# Patient Record
Sex: Male | Born: 1955 | Race: White | Hispanic: No | Marital: Married | Smoking: Former smoker
Health system: Southern US, Community
[De-identification: ages and names within clinical notes are randomized; demographics above are authoritative.]

## PROBLEM LIST (undated history)

## (undated) DIAGNOSIS — I219 Acute myocardial infarction, unspecified: Secondary | ICD-10-CM

## (undated) DIAGNOSIS — E785 Hyperlipidemia, unspecified: Secondary | ICD-10-CM

## (undated) DIAGNOSIS — I10 Essential (primary) hypertension: Secondary | ICD-10-CM

## (undated) DIAGNOSIS — I2510 Atherosclerotic heart disease of native coronary artery without angina pectoris: Principal | ICD-10-CM

## (undated) DIAGNOSIS — R0683 Snoring: Secondary | ICD-10-CM

## (undated) DIAGNOSIS — I209 Angina pectoris, unspecified: Secondary | ICD-10-CM

## (undated) DIAGNOSIS — F17200 Nicotine dependence, unspecified, uncomplicated: Secondary | ICD-10-CM

## (undated) DIAGNOSIS — I2119 ST elevation (STEMI) myocardial infarction involving other coronary artery of inferior wall: Secondary | ICD-10-CM

## (undated) DIAGNOSIS — R079 Chest pain, unspecified: Secondary | ICD-10-CM

## (undated) HISTORY — DX: Atherosclerotic heart disease of native coronary artery without angina pectoris: I25.10

## (undated) HISTORY — DX: Hyperlipidemia, unspecified: E78.5

## (undated) HISTORY — DX: Snoring: R06.83

## (undated) HISTORY — PX: BASAL CELL CARCINOMA EXCISION: SHX1214

## (undated) HISTORY — DX: Essential (primary) hypertension: I10

## (undated) HISTORY — DX: Nicotine dependence, unspecified, uncomplicated: F17.200

## (undated) HISTORY — PX: NECK SURGERY: SHX720

## (undated) HISTORY — PX: HAND SURGERY: SHX662

## (undated) HISTORY — PX: ANKLE FRACTURE SURGERY: SHX122

## (undated) HISTORY — DX: ST elevation (STEMI) myocardial infarction involving other coronary artery of inferior wall: I21.19

## (undated) HISTORY — DX: Chest pain, unspecified: R07.9

## (undated) HISTORY — PX: CORONARY STENT PLACEMENT: SHX6853

---

## 1998-05-07 ENCOUNTER — Ambulatory Visit (HOSPITAL_COMMUNITY): Admission: RE | Admit: 1998-05-07 | Discharge: 1998-05-07 | Payer: Self-pay | Admitting: Neurosurgery

## 1998-05-14 ENCOUNTER — Ambulatory Visit (HOSPITAL_COMMUNITY): Admission: RE | Admit: 1998-05-14 | Discharge: 1998-05-14 | Payer: Self-pay | Admitting: Neurosurgery

## 1998-11-21 ENCOUNTER — Encounter: Payer: Self-pay | Admitting: Family Medicine

## 1998-11-21 ENCOUNTER — Ambulatory Visit (HOSPITAL_COMMUNITY): Admission: RE | Admit: 1998-11-21 | Discharge: 1998-11-21 | Payer: Self-pay | Admitting: Family Medicine

## 2001-11-01 ENCOUNTER — Encounter: Payer: Self-pay | Admitting: Emergency Medicine

## 2001-11-01 ENCOUNTER — Observation Stay (HOSPITAL_COMMUNITY): Admission: AC | Admit: 2001-11-01 | Discharge: 2001-11-02 | Payer: Self-pay

## 2007-03-19 ENCOUNTER — Ambulatory Visit (HOSPITAL_COMMUNITY): Admission: RE | Admit: 2007-03-19 | Discharge: 2007-03-19 | Payer: Self-pay | Admitting: Family Medicine

## 2007-10-08 ENCOUNTER — Ambulatory Visit (HOSPITAL_BASED_OUTPATIENT_CLINIC_OR_DEPARTMENT_OTHER): Admission: RE | Admit: 2007-10-08 | Discharge: 2007-10-08 | Payer: Self-pay | Admitting: Orthopedic Surgery

## 2010-12-06 DIAGNOSIS — I2510 Atherosclerotic heart disease of native coronary artery without angina pectoris: Secondary | ICD-10-CM

## 2010-12-06 HISTORY — DX: Atherosclerotic heart disease of native coronary artery without angina pectoris: I25.10

## 2010-12-12 ENCOUNTER — Inpatient Hospital Stay (HOSPITAL_COMMUNITY)
Admission: EM | Admit: 2010-12-12 | Discharge: 2010-12-15 | DRG: 808 | Disposition: A | Discharge status: Home / Self Care | Payer: BC Managed Care – PPO | Source: Ambulatory Visit | Attending: Cardiology | Admitting: Cardiology

## 2010-12-12 ENCOUNTER — Inpatient Hospital Stay (HOSPITAL_COMMUNITY): Admit: 2010-12-12 | Payer: Self-pay

## 2010-12-12 ENCOUNTER — Inpatient Hospital Stay (HOSPITAL_COMMUNITY): Payer: BC Managed Care – PPO

## 2010-12-12 DIAGNOSIS — Z7982 Long term (current) use of aspirin: Secondary | ICD-10-CM

## 2010-12-12 DIAGNOSIS — R55 Syncope and collapse: Secondary | ICD-10-CM | POA: Diagnosis present

## 2010-12-12 DIAGNOSIS — Z7902 Long term (current) use of antithrombotics/antiplatelets: Secondary | ICD-10-CM

## 2010-12-12 DIAGNOSIS — Y92009 Unspecified place in unspecified non-institutional (private) residence as the place of occurrence of the external cause: Secondary | ICD-10-CM

## 2010-12-12 DIAGNOSIS — Z006 Encounter for examination for normal comparison and control in clinical research program: Secondary | ICD-10-CM

## 2010-12-12 DIAGNOSIS — E785 Hyperlipidemia, unspecified: Secondary | ICD-10-CM | POA: Diagnosis present

## 2010-12-12 DIAGNOSIS — I2119 ST elevation (STEMI) myocardial infarction involving other coronary artery of inferior wall: Secondary | ICD-10-CM

## 2010-12-12 DIAGNOSIS — F17200 Nicotine dependence, unspecified, uncomplicated: Secondary | ICD-10-CM | POA: Diagnosis present

## 2010-12-12 DIAGNOSIS — Z8249 Family history of ischemic heart disease and other diseases of the circulatory system: Secondary | ICD-10-CM

## 2010-12-12 DIAGNOSIS — I2510 Atherosclerotic heart disease of native coronary artery without angina pectoris: Secondary | ICD-10-CM | POA: Diagnosis present

## 2010-12-12 DIAGNOSIS — I4891 Unspecified atrial fibrillation: Secondary | ICD-10-CM | POA: Diagnosis present

## 2010-12-12 DIAGNOSIS — S0180XA Unspecified open wound of other part of head, initial encounter: Secondary | ICD-10-CM | POA: Diagnosis present

## 2010-12-12 DIAGNOSIS — I219 Acute myocardial infarction, unspecified: Secondary | ICD-10-CM

## 2010-12-12 DIAGNOSIS — W1809XA Striking against other object with subsequent fall, initial encounter: Secondary | ICD-10-CM | POA: Diagnosis present

## 2010-12-12 LAB — CBC
HCT: 43 % (ref 39.0–52.0)
Hemoglobin: 14.6 g/dL (ref 13.0–17.0)
MCH: 30.1 pg (ref 26.0–34.0)
MCHC: 35.1 g/dL (ref 30.0–36.0)
Platelets: 260 10*3/uL (ref 150–400)
RBC: 4.88 MIL/uL (ref 4.22–5.81)
RDW: 13.2 % (ref 11.5–15.5)

## 2010-12-12 LAB — CARDIAC PANEL(CRET KIN+CKTOT+MB+TROPI)
CK, MB: 322.1 ng/mL (ref 0.3–4.0)
Relative Index: 12.4 — ABNORMAL HIGH (ref 0.0–2.5)
Relative Index: 13 — ABNORMAL HIGH (ref 0.0–2.5)
Relative Index: 14.5 — ABNORMAL HIGH (ref 0.0–2.5)
Total CK: 156 U/L (ref 7–232)
Total CK: 2227 U/L — ABNORMAL HIGH (ref 7–232)
Troponin I: 0.45 ng/mL — ABNORMAL HIGH (ref 0.00–0.06)
Troponin I: 0.46 ng/mL — ABNORMAL HIGH (ref 0.00–0.06)
Troponin I: 36.53 ng/mL (ref 0.00–0.06)

## 2010-12-12 LAB — PROTIME-INR
INR: 6.23 (ref 0.00–1.49)
Prothrombin Time: 54.7 seconds — ABNORMAL HIGH (ref 11.6–15.2)

## 2010-12-12 LAB — DIFFERENTIAL
Basophils Absolute: 0 10*3/uL (ref 0.0–0.1)
Basophils Relative: 0 % (ref 0–1)
Lymphocytes Relative: 9 % — ABNORMAL LOW (ref 12–46)
Monocytes Absolute: 1.3 10*3/uL — ABNORMAL HIGH (ref 0.1–1.0)
Monocytes Relative: 8 % (ref 3–12)
Neutro Abs: 12.9 10*3/uL — ABNORMAL HIGH (ref 1.7–7.7)
Neutrophils Relative %: 83 % — ABNORMAL HIGH (ref 43–77)

## 2010-12-12 LAB — POCT I-STAT, CHEM 8
Calcium, Ion: 1.11 mmol/L — ABNORMAL LOW (ref 1.12–1.32)
Creatinine, Ser: 1.1 mg/dL (ref 0.4–1.5)
Glucose, Bld: 146 mg/dL — ABNORMAL HIGH (ref 70–99)
HCT: 44 % (ref 39.0–52.0)
Hemoglobin: 15 g/dL (ref 13.0–17.0)
Potassium: 4 mEq/L (ref 3.5–5.1)
TCO2: 17 mmol/L (ref 0–100)

## 2010-12-12 LAB — COMPREHENSIVE METABOLIC PANEL
ALT: 25 U/L (ref 0–53)
BUN: 7 mg/dL (ref 6–23)
CO2: 16 mEq/L — ABNORMAL LOW (ref 19–32)
Calcium: 8.4 mg/dL (ref 8.4–10.5)
GFR calc non Af Amer: >60 mL/min (ref >60)
Glucose, Bld: 145 mg/dL — ABNORMAL HIGH (ref 70–99)
Total Protein: 6.1 g/dL (ref 6.0–8.3)

## 2010-12-12 LAB — MRSA PCR SCREENING: MRSA by PCR: NEGATIVE

## 2010-12-12 LAB — TSH: TSH: 0.43 u[IU]/mL (ref 0.350–4.500)

## 2010-12-13 LAB — BASIC METABOLIC PANEL
CO2: 21 mEq/L (ref 19–32)
Calcium: 8.4 mg/dL (ref 8.4–10.5)
Chloride: 110 mEq/L (ref 96–112)
GFR calc Af Amer: >60 mL/min (ref >60)
Potassium: 3.5 mEq/L (ref 3.5–5.1)
Sodium: 140 mEq/L (ref 135–145)

## 2010-12-13 LAB — CBC
HCT: 36.8 % — ABNORMAL LOW (ref 39.0–52.0)
Hemoglobin: 12.5 g/dL — ABNORMAL LOW (ref 13.0–17.0)
MCHC: 34 g/dL (ref 30.0–36.0)
RBC: 4.14 MIL/uL — ABNORMAL LOW (ref 4.22–5.81)
WBC: 13.6 10*3/uL — ABNORMAL HIGH (ref 4.0–10.5)

## 2010-12-13 LAB — LIPID PANEL
HDL: 28 mg/dL — ABNORMAL LOW (ref >39)
LDL Cholesterol: 67 mg/dL (ref 0–99)
Triglycerides: 115 mg/dL (ref <150)
VLDL: 23 mg/dL (ref 0–40)

## 2010-12-13 LAB — CARDIAC PANEL(CRET KIN+CKTOT+MB+TROPI): Relative Index: 11.6 — ABNORMAL HIGH (ref 0.0–2.5)

## 2010-12-13 LAB — HEMOGLOBIN A1C: Hgb A1c MFr Bld: 5.5 % (ref <5.7)

## 2010-12-14 DIAGNOSIS — I214 Non-ST elevation (NSTEMI) myocardial infarction: Secondary | ICD-10-CM

## 2010-12-14 LAB — CBC
HCT: 38.8 % — ABNORMAL LOW (ref 39.0–52.0)
Hemoglobin: 13.2 g/dL (ref 13.0–17.0)
MCHC: 34 g/dL (ref 30.0–36.0)
MCV: 88.8 fL (ref 78.0–100.0)

## 2010-12-14 LAB — BASIC METABOLIC PANEL
CO2: 21 mEq/L (ref 19–32)
Calcium: 8.3 mg/dL — ABNORMAL LOW (ref 8.4–10.5)
Glucose, Bld: 88 mg/dL (ref 70–99)
Potassium: 3.8 mEq/L (ref 3.5–5.1)
Sodium: 138 mEq/L (ref 135–145)

## 2010-12-18 NOTE — Op Note (Signed)
  NAME:  [name], [name]                ACCOUNT NO.:  [id number]  MEDICAL RECORD NO.:  [id number]           PATIENT TYPE:  I  LOCATION:  2903                         FACILITY:  [facility]  PHYSICIAN:  Gabrielle Dare. Janee Morn, M.D.DATE OF BIRTH:  [birth year]  DATE OF PROCEDURE:  12/12/2010 DATE OF DISCHARGE:                              OPERATIVE REPORT   CHIEF COMPLAINT:  Chin laceration.  HISTORY OF PRESENT ILLNESS:  Mr. Jungwirth is a 55 year old gentleman who fell after suffering myocardial infarction.  He was admitted to the cardiology service and has been taken to the cath lab.  He suffered a 2- cm chin laceration.  We were asked to see him for closure.  PROCEDURE IN DETAIL:  The patient was identified in the cardiac intensive care unit.  He was agreeable to the procedure which we discussed in some detail.  He gave his consent and we proceeded.  PROCEDURE IN DETAIL:  The patient's chin was prepped in sterile fashion and then draped.  The wound was thoroughly cleaned with Betadine.  Next, 2% lidocaine with epinephrine was injected for local anesthetic.  The wound was then closed in a simple fashion with interrupted 4-0 Prolene sutures.  It came together without tension and good approximation.  Some bacitracin to a sterile gauze were applied.  The patient tolerated the procedure well.  Please note, we did not do a time-out procedure at the beginning.  There were no complications.     Gabrielle Dare Janee Morn, M.D.     BET/MEDQ  D:  12/12/2010  T:  12/13/2010  Job:  [id number]  cc:   Peter M. [place], M.D.  Electronically Signed by Violeta Gelinas M.D. on 12/18/2010 10:14:42 AM

## 2010-12-19 LAB — CARDIAC PANEL(CRET KIN+CKTOT+MB+TROPI)
CK, MB: 315.7 ng/mL (ref 0.3–4.0)
Relative Index: 11.2 — ABNORMAL HIGH (ref 0.0–2.5)

## 2010-12-20 NOTE — Procedures (Signed)
NAME:  Daniel Bonilla, Daniel Bonilla NO.:  [id number]  MEDICAL RECORD NO.:  [id number]           PATIENT TYPE:  I  LOCATION:  2903                         FACILITY:  [facility]  PHYSICIAN:  Peter M. Sw[place]iland, M.D.  DATE OF BIRTH:  [birth year]  DATE OF PROCEDURE:  12/12/2010 DATE OF DISCHARGE:                           CARDIAC CATHETERIZATION   SURGEON:  Peter M. [place], MD  INDICATIONS FOR PROCEDURE:  This is a 55 year old white male with a history of tobacco use who presents with an acute ST elevation myocardial infarction.  He has 3-4 mm of ST elevation in leads 2, 3, and aVF with reciprocal ST depression in the anterior leads.  The patient was also in atrial fibrillation on presentation to the lab.  He was hypotensive.  He was having ongoing chest pain.  PROCEDURES:  Left heart catheterization, coronary and left ventricular angiography, and intracoronary stenting of the distal left circumflex coronary artery.  ACCESS:  Via the right femoral artery using standard Seldinger technique.  EQUIPMENT:  6-French 4-cm right and left Judkins catheter, 6-French pigtail catheter, 6-French arterial sheath, 6-French FL-4 guide, a Prowater wire, a 2.5 x 15-mm apex balloon, a 2.5 x 18 mm Multi-Link Vision stent, a 2.5 x 15 mm [place] Trek balloon, and Export extraction catheter.  MEDICATIONS:  The patient was enrolled in our [place] study. Local anesthesia 1% Xylocaine, Versed 2 mg IV, Angiomax 0.75 mg/kg bolus followed by continuous infusion at 1.75 mg/kg per hour.  Subsequent ACT was 340 seconds.  CONTRAST:  200 mL of Omnipaque.  HEMODYNAMIC DATA:  Aortic pressure was 83/59 with a mean of 72 mmHg. Left ventricular pressure was 83 with EDP of 18 mmHg.  ANGIOGRAPHIC DATA:  The left coronary artery arises normally and is a large dominant vessel.  The left main coronary artery is normal.  The left anterior descending artery is a large vessel.  At the apex, there is a 99% stenosis  at the distal fork.  This has TIMI grade 1 flow distally.  This is a very small vessel at this location.  There is a moderate-sized diagonal vessel which appears normal.  The left circumflex coronary artery is a large dominant vessel.  There is a moderate-sized intermediate vessel which is normal.  The circumflex then gives off 4 marginal branches which appear normal.  The circumflex is occluded distally prior to the takeoff of the PDA.  The right coronary artery arises normally.  It is a small, nondominant vessel and is normal.  We proceeded with acute intervention of the distal circumflex lesion. This was crossed with the wire without much difficulty.  We initially performed balloon inflation with 2.5 mm apex balloon dilating up to 4 atmospheres and then 6 atmospheres.  Despite good balloon expansion, there was no reperfusion at this point.  We then used an Export catheter for extraction.  This yielded a moderate amount of white thrombus from the lesion and did restore flow to the PDA.  The PDA was actually a fairly small vessel.  We then stented the distal circumflex using a 2.5 x 180 mm Vision stent.  This  was deployed at 9 atmospheres.  We then postdilated using the 2.5 x 15 mm [place] Trek up to 12 atmospheres in the distal stent and 14 atmospheres in the proximal stent.  This resulted in 0% residual stenosis and TIMI grade 3 flow.  At this point, the patient's pain had decreased to 1/10.  He still had some ST elevation, but it was improved.  He remained in atrial fibrillation with controlled ventricular response.  Left ventricular angiography at this point demonstrated inferoapical severe hypokinesia with overall well-preserved LV systolic function and ejection fraction of 50%.  There was no significant mitral insufficiency.  FINAL INTERPRETATION: 1. Two-vessel obstructive coronary artery disease.  Culprit lesion is     occlusion of the distal circumflex involving the PDA.  He also  has     a high-grade stenosis at the terminal LAD at the distal fork. 2. Good left ventricular systolic function. 3. Successful stenting of the distal left circumflex coronary artery     with a bare-metal stent.          ______________________________ Peter M. [place], M.D.     PMJ/MEDQ  D:  12/12/2010  T:  12/13/2010  Job:  [id number]  cc:   [facility]  Electronically Signed by PETER Swaz[place]and M.D. on 12/20/2010 11:18:01 AM

## 2010-12-20 NOTE — H&P (Signed)
NAME:  Daniel Bonilla, Daniel Bonilla NO.:  [id number]  MEDICAL RECORD NO.:  [id number]           PATIENT TYPE:  I  LOCATION:  2903                         FACILITY:  [facility]  PHYSICIAN:  Ahtziry Saathoff M. [place], M.D.  DATE OF BIRTH:  1956/01/14  DATE OF ADMISSION:  12/12/2010 DATE OF DISCHARGE:                             HISTORY & PHYSICAL   PRIMARY CARDIOLOGIST:  New to [facility], being seen by Dr. Swaziland.  PRIMARY CARE PROVIDER:  [facility]  PROFILE:  A 55 year old Caucasian male without prior cardiac history, presents with acute inferolateral ST elevation MI.  PROBLEMS: 1. Acute inferolateral ST elevation myocardial infarction. 2. Untreated hyperlipidemia. 3. Ongoing tobacco abuse. 4. History of cervical neck surgery. 5. History of knee surgery. 6. Recent history of right shoulder pain. 7. History of right hand surgery, December 2008. 8. Status post open reduction and internal fixation of the distal     tibia-fibula fracture, December 2002.  ALLERGIES:  No known drug allergies.  HISTORY OF PRESENT ILLNESS:  A 55 year old male without prior cardiac history.  He was in his usual state of health until this morning at approximately 8:30 a.m. when he developed mild chest tightness with diaphoresis and then of his jaw.  The patient was standing and became lightheaded and having syncopal episode, striking his jaw on the toilet. He is not clear as how long he was without consciousness.  When he regained consciousness, he noted that he had a laceration in his jaw with bleeding.  He called to his son and called his mother who then called EMS.  Upon their arrival, he was noted to have 2-3 mm ST segment elevation in leads II, III, aVF and V5 and V6 with ST depression in I, aVL, V1 through V4.  Code STEMI was activated and the patient was taken to the [facility].  Still complaining a 5/10 chest pain prior to his procedure.  HOME MEDICATIONS:   Hydrocodone p.r.n.  The patient took his first dose yesterday for right shoulder pain.  FAMILY HISTORY:  His mother is alive and well.  Father died at 38 with an MI.  Sister is alive and well.  SOCIAL HISTORY:  The patient lives in [place] with his wife.  He works as an Land.  He has two grown children.  He has been smoking on and off since age 59.  He drinks about 2-6 packs of beer per week.  He previously used recreational drugs, but none in many years per his report.  He is active, but does not routinely exercise.  REVIEW OF SYSTEMS:  Positive for chest pain, dyspnea, presyncope and syncope, chills and diaphoresis.  He is a full code.  Otherwise, all systems reviewed negative.  PHYSICAL EXAMINATION:  VITAL SIGNS:  Afebrile, heart rate 87, respirations 28, blood pressure 112/80, pulse ox 98% on 2 liters, weight is 170 pounds. GENERAL:  Pleasant white male in no acute distress.  Awake, alert and oriented x3. PSYCHIATRY:  He has normal affect. HEENT:  Normal. NEUROLOGIC:  Grossly intact and nonfocal. SKIN:  Warm and dry with small  laceration under his chin. NECK:  Supple without bruits or JVD. LUNGS:  Respirations are regular and unlabored.  Clear to auscultation. CARDIAC:  Regular.  S1 and S2.  No S3, S4, or murmurs. ABDOMEN:  Round, soft, nontender, and nondistended.  Bowel sounds present x4. EXTREMITIES:  Warm, dry, and pink.  No clubbing, cyanosis, or edema. Dorsalis pedis and posterior tibial pulses are 1+ and equal bilaterally.  Chest x-ray is pending.  EKG shows sinus rhythm at a rate of 69 beats per minute with 2-3 mm ST-segment elevation in leads II, III, aVF, V5 through V6 along with reciprocal changes in I, aVL, V1 through V4. Sodium 131, potassium 4.0, BUN 8, creatinine 1.1, glucose 146.  ASSESSMENT AND PLAN: 1. Acute inferolateral ST elevation myocardial infarction.  Emergent     catheterization has shown occluded left circumflex and  percutaneous     coronary intervention is ongoing.  The patient has been enrolled in     a [facility] trial.  Plan Plavix tomorrow for     research protocol.  Add aspirin, statin, and beta-blocker (as     tolerated).  Eventual cardiac rehab. 2. Lipid status currently unknown.  Check lipids and LFTs.  Add high-     dose statin. 3. Tobacco abuse.  Plan cessation counseling. 4. Syncope likely arrhythmic in the setting of ischemia.  Add beta-     blocker as tolerated.  Follow on telemetry.     Nicolasa Ducking, ANP   ______________________________ Linsay Vogt M. [place], M.D.    CB/MEDQ  D:  12/12/2010  T:  12/13/2010  Job:  [id number]  Electronically Signed by Nicolasa Ducking ANP on 12/18/2010 12:07:57 PM Electronically Signed by Darean Rote [place] M.D. on 12/20/2010 11:17:41 AM

## 2010-12-29 ENCOUNTER — Encounter (INDEPENDENT_AMBULATORY_CARE_PROVIDER_SITE_OTHER): Payer: BC Managed Care – PPO | Admitting: Cardiology

## 2010-12-29 DIAGNOSIS — I2109 ST elevation (STEMI) myocardial infarction involving other coronary artery of anterior wall: Secondary | ICD-10-CM

## 2010-12-29 DIAGNOSIS — E7800 Pure hypercholesterolemia, unspecified: Secondary | ICD-10-CM

## 2010-12-29 DIAGNOSIS — F17200 Nicotine dependence, unspecified, uncomplicated: Secondary | ICD-10-CM

## 2011-01-03 NOTE — Discharge Summary (Signed)
NAME:  Daniel Bonilla, Daniel Bonilla NO.:  [id number]  MEDICAL RECORD NO.:  [id number]           PATIENT TYPE:  I  LOCATION:  4708                         FACILITY:  [facility]  PHYSICIAN:  Peter M. [place]and, M.D.  DATE OF BIRTH:  1955-12-12  DATE OF ADMISSION:  12/12/2010 DATE OF DISCHARGE:  12/15/2010                              DISCHARGE SUMMARY   PROCEDURES: 1. Cardiac catheterization. 2. Coronary arteriogram. 3. Left ventriculogram. 4. PTCA and 2.5 x 18 mm Multilink Vision bare-metal stent to the     distal circumflex. 5. Intravascular ultrasound of the left main. 6. Portable chest x-ray. 7. A 2-D echocardiogram.  PRIMARY FINAL DISCHARGE DIAGNOSES: 1. Acute inferolateral ST elevation myocardial infarction. 2. Syncope.  SECONDARY DIAGNOSES: 1. Mild left ventricular dysfunction with an EF of 50% at cath and 45-     50% by echocardiogram this admission. 2. Family history of coronary artery disease in his father. 3. Hyperlipidemia. 4. Tobacco use. 5. Status post neck surgery, knee surgery, and right hand surgery as     well as distal tib-fib repair. 6. History of right shoulder pain.  TIME OF DISCHARGE:  Forty-two minutes.  HOSPITAL COURSE:  Daniel Bonilla is a 55 year old male with no previous history of coronary artery disease.  He developed chest tightness and then had a syncopal episode.  He came to the hospital where he was having ongoing pain with ST elevation in leads II, III, aVF, as well as V5 and V6.  He was taken directly to the cath lab.  The cardiac catheterization showed a 99% stenosis at the distal portion in the LAD with TIMI-1 flow.  The circumflex was occluded prior to the takeoff of the PDA.  The circumflex was felt to be the culprit lesion and this was treated with PTCA, Export catheter for extraction and a bare-metal stent.  He had atrial fibrillation on admission and during the procedure.  He spontaneously converted to sinus  rhythm postprocedure.  He had syncope prior to admission and fell, lacerating his chin.  This was treated by the trauma team and sutured.  He was supposed to discontinue the sutures in 5 days.  Daniel Bonilla cardiac enzymes peaked at a CK-MB of 227/322 and a troponin I of 87.55.  He had dyslipidemia with an HDL of 28 and LDL of 67.  Hemoglobin A1c was 5.5, and a TSH was within normal limits.  His white count was elevated, but otherwise the CBC had no significant abnormalities.  A chest x-ray showed no acute disease and an echocardiogram showed an EF of 45-50% with some wall motion abnormalities and diastolic function was indeterminant.  Central venous pressure was felt to be normal.  Daniel Bonilla was seen by Cardiac Rehab and ambulated with them.  He will follow up with cardiac rehab as an outpatient.  By December 15, 2010, Daniel Bonilla had improved to the point that Dr. Swaziland considered him stable for discharge.  DISCHARGE INSTRUCTIONS: 1. His activity level is to be increased gradually.  He is to call our     office for problems with the cath site.  He  is not to do any     lifting for 2 weeks.  He is to do no driving four days and no     sexual activity for a week.  He may shower.  He is to apply     antibiotic ointment to his chin and keep the incision clean.  He is     to follow up with the [facility]uma [facility] on December 21, 2010, at 2 p.m.     and with Dr. [place]iland on December 29, 2010, at 4:15.  He is to follow     up with [facility] as needed or as     scheduled.  DISCHARGE MEDICATIONS: 1. Tylenol 325 mg 1-2 tabs q.4 hours p.r.n. 2. Lopressor 50 mg b.i.d. 3. Crestor 40 mg a day. 4. Sublingual nitroglycerin p.r.n. 5. Ibuprofen 200 mg 2-3 tablets q.8 hours p.r.n. as prior to     admission. 6. Hydrocodone p.r.n. as prior to admission. 7. Aspirin 81 mg daily. 8. Plavix 75 mg daily.     Theodore Demark, PA-C   ______________________________ Peter M.  [place], M.D.    RB/MEDQ  D:  12/15/2010  T:  12/16/2010  Job:  [id number]  cc:   [facility]  Electronically Signed by Theodore Demark PA-C on 12/22/2010 12:05:59 PM Electronically Signed by PETER [place] M.D. on 01/03/2011 04:49:28 PM

## 2011-01-11 NOTE — Procedures (Signed)
  NAME:  Daniel Bonilla, Daniel Bonilla                ACCOUNT NO.:  [id number]  MEDICAL RECORD NO.:  [id number]           PATIENT TYPE:  I  LOCATION:  2903                         FACILITY:  [facility]  PHYSICIAN:  Veverly Fells. Excell Seltzer, MD  DATE OF BIRTH:  [birth year]  DATE OF PROCEDURE:  12/13/2010 DATE OF DISCHARGE:                           CARDIAC CATHETERIZATION   PROCEDURE:  Intravascular ultrasound of the left main.  SURGEON:  Veverly Fells. Excell Seltzer, MD  Hilarie Fredrickson INDICATIONS:  [name] is a 55 year old gentleman who presented yesterday with an inferolateral and ST-elevation infarction. He was treated with primary PCI of the left circumflex.  He also had slow flow noted.  The patient's culprit vessel was the left circumflex which was a dominant vessel.  He also had slow flow noted in the LAD. Upon careful review of the films, there was a filling defect early on in the case at the left main origin adjacent to the guide catheter.  This appeared to resolve as the case went on.  The patient was brought back today for intravascular ultrasound of the left main to make sure that there was not a ruptured plaque or residual thrombus in the left mainstem.  DESCRIPTION OF PROCEDURE:  Risks and indications of the procedure were reviewed with the patient and informed consent was obtained.  The right groin was prepped, draped, and anesthetized with 1% lidocaine using modified Seldinger technique.  A 6-French sheath was placed in the right femoral artery.  5000 units of heparin was administered at the time of arterial access.  A 6-French JL 3.5 cm guide catheter was inserted. Once the therapeutic ACT was achieved, a Cougar guidewire was advanced into the LAD without difficulty.  Intravascular ultrasound was performed from the proximal LAD back into the left mainstem with the guide catheter out in the ascending aorta.  The entire left mainstem was well visualized.  The patient tolerated the procedure well.  There  were no immediate complications.  Perclose device was used for femoral hemostasis.  PROCEDURAL FINDINGS:  The left mainstem is widely patent.  There is no residual filling defect visualized by angiography.  There is no significant stenosis by angiography.  There is minor irregularity of the left main noted.  It divides into the LAD and left circumflex.  The LAD and left circumflex are both patent.  Both vessels have TIMI 3 flow.  The stent in the distal left circumflex is patent.  The LAD has normal flow out to the LV apex.  INTRAVASCULAR ULTRASOUND FINDINGS:  The left main is widely patent throughout.  There is no significant stenosis by intravascular ultrasound.  There is minimal plaque.  There is no evidence of fissure or ulceration.  FINAL ASSESSMENT:  Widely patent left mainstem with no evidence of residual thrombus or plaque fissure.     Veverly Fells. Excell Seltzer, MD     MDC/MEDQ  D:  12/13/2010  T:  12/14/2010  Job:  [id number]  Electronically Signed by Tonny Bollman MD on 01/09/2011 08:55:58 PM

## 2011-02-23 ENCOUNTER — Other Ambulatory Visit: Payer: Self-pay | Admitting: *Deleted

## 2011-02-23 DIAGNOSIS — E7800 Pure hypercholesterolemia, unspecified: Secondary | ICD-10-CM

## 2011-02-28 ENCOUNTER — Ambulatory Visit (INDEPENDENT_AMBULATORY_CARE_PROVIDER_SITE_OTHER): Payer: BC Managed Care – PPO | Admitting: Cardiology

## 2011-02-28 ENCOUNTER — Encounter: Payer: Self-pay | Admitting: Cardiology

## 2011-02-28 ENCOUNTER — Other Ambulatory Visit (INDEPENDENT_AMBULATORY_CARE_PROVIDER_SITE_OTHER): Payer: BC Managed Care – PPO | Admitting: *Deleted

## 2011-02-28 DIAGNOSIS — E7800 Pure hypercholesterolemia, unspecified: Secondary | ICD-10-CM

## 2011-02-28 DIAGNOSIS — I10 Essential (primary) hypertension: Secondary | ICD-10-CM | POA: Insufficient documentation

## 2011-02-28 DIAGNOSIS — F17200 Nicotine dependence, unspecified, uncomplicated: Secondary | ICD-10-CM | POA: Insufficient documentation

## 2011-02-28 DIAGNOSIS — I2119 ST elevation (STEMI) myocardial infarction involving other coronary artery of inferior wall: Secondary | ICD-10-CM | POA: Insufficient documentation

## 2011-02-28 DIAGNOSIS — E785 Hyperlipidemia, unspecified: Secondary | ICD-10-CM | POA: Insufficient documentation

## 2011-02-28 DIAGNOSIS — I2510 Atherosclerotic heart disease of native coronary artery without angina pectoris: Secondary | ICD-10-CM

## 2011-02-28 LAB — HEPATIC FUNCTION PANEL
Alkaline Phosphatase: 91 U/L (ref 39–117)
Bilirubin, Direct: 0.1 mg/dL (ref 0.0–0.3)

## 2011-02-28 LAB — BASIC METABOLIC PANEL
Calcium: 9.3 mg/dL (ref 8.4–10.5)
GFR: 70.06 mL/min (ref >60.00)
Sodium: 138 mEq/L (ref 135–145)

## 2011-02-28 LAB — LIPID PANEL
HDL: 35.3 mg/dL — ABNORMAL LOW (ref >39.00)
Total CHOL/HDL Ratio: 5
VLDL: 31 mg/dL (ref 0.0–40.0)

## 2011-02-28 NOTE — Assessment & Plan Note (Signed)
We'll check fasting blood work today. He is on high dose of Crestor.

## 2011-02-28 NOTE — Assessment & Plan Note (Signed)
Continue to encourage smoking cessation. 

## 2011-02-28 NOTE — Assessment & Plan Note (Signed)
Blood pressure is borderline elevated today. I've asked that he monitor his blood pressure and let us know if his readings are staying over 135 systolic or 85 diastolic. If so I would add an ACE inhibitor.

## 2011-02-28 NOTE — Progress Notes (Signed)
   Daniel Bonilla Date of Birth: 02-26-1956   History of Present Illness: Daniel Bonilla is seen for followup today. He is doing fairly well. He still notes occasional mild tightness in his chest usually when he is under very stressful situation such as arguing with his wife. He's had no other anginal symptoms or shortness of breath. He continues to abstain from tobacco. He has had no palpitations. He did resume taking Plavix and has not experienced any recurrent rash.  Current Outpatient Prescriptions on File Prior to Visit  Medication Sig Dispense Refill  . aspirin 81 MG tablet Take 81 mg by mouth daily.        . clopidogrel (PLAVIX) 75 MG tablet Take 75 mg by mouth daily.        Marland Kitchen ibuprofen (ADVIL,MOTRIN) 200 MG tablet Take 200 mg by mouth every 6 (six) hours as needed.        . metoprolol (LOPRESSOR) 50 MG tablet Take 50 mg by mouth 2 (two) times daily.        . nitroGLYCERIN (NITROSTAT) 0.4 MG SL tablet Place 0.4 mg under the tongue every 5 (five) minutes as needed.        . rosuvastatin (CRESTOR) 40 MG tablet Take 40 mg by mouth daily.          No Known Allergies  Past Medical History  Diagnosis Date  . Tobacco dependence   . Dyslipidemia   . Hyperlipidemia   . Myocardial infarction of inferolateral wall     Past Surgical History  Procedure Date  . Ankle fracture surgery   . Basal cell carcinoma excision     eyelid, nose  . Neck surgery   . Hand surgery     History  Smoking status  . Former Smoker  . Quit date: 12/09/2010  Smokeless tobacco  . Not on file    History  Alcohol Use No    History reviewed. No pertinent family history.  Review of Systems: The review of systems is positive for mild intermittent chest tightness.  All other systems were reviewed and are negative.  Physical Exam: BP 130/90  Pulse 80  Ht 5\' 9"  (1.753 m)  Wt 175 lb (79.379 kg)  BMI 25.84 kg/m2 The patient is alert and oriented x 3.  The mood and affect are normal.  The skin is warm and  dry.  Color is normal.  The HEENT exam reveals that the sclera are nonicteric.  The mucous membranes are moist.  The carotids are 2+ without bruits.  There is no thyromegaly.  There is no JVD.  The lungs are clear.  The chest wall is non tender.  The heart exam reveals a regular rate with a normal S1 and S2.  There are no murmurs, gallops, or rubs.  The PMI is not displaced.   Abdominal exam reveals good bowel sounds.  There is no guarding or rebound.  There is no hepatosplenomegaly or tenderness.  There are no masses.  Exam of the legs reveal no clubbing, cyanosis, or edema.  The legs are without rashes.  The distal pulses are intact.  Cranial nerves II - XII are intact.  Motor and sensory functions are intact.  The gait is normal.  LABORATORY DATA:   Assessment / Plan:

## 2011-02-28 NOTE — Patient Instructions (Signed)
We will call you with the results of your blood work today.  Continue current medications.  Monitor your blood pressure at home. Call if your readings are more than 135 (top number) or 85 (bottom number)  Continue with your smoking cessation.  We will see you back for an office visit in 6 months.

## 2011-02-28 NOTE — Assessment & Plan Note (Signed)
Clinically doing well. We'll continue on combined aspirin and Plavix therapy. We will continue with risk factor modification.

## 2011-03-01 ENCOUNTER — Telehealth: Payer: Self-pay | Admitting: *Deleted

## 2011-03-01 NOTE — Telephone Encounter (Signed)
Lm wl lab results.

## 2011-03-01 NOTE — Telephone Encounter (Signed)
Message copied by Murrell Redden on Thu Mar 01, 2011  2:47 PM ------      Message from: [place], PETER      Created: Wed Feb 28, 2011  7:32 PM       Chemistries all ok. LDL goal 70. Recommend staying on Crestor 40 mg daily.

## 2011-03-20 NOTE — Op Note (Signed)
NAME:  Daniel Bonilla, Daniel Bonilla                ACCOUNT NO.:  [id number]   MEDICAL RECORD NO.:  [id number]          PATIENT TYPE:  AMB   LOCATION:  DSC                          FACILITY:  [facility]   PHYSICIAN:  Artist Pais. Weingold, M.D.DATE OF BIRTH:  October 03, 1956   DATE OF PROCEDURE:  10/08/2007  DATE OF DISCHARGE:                               OPERATIVE REPORT   PREOPERATIVE DIAGNOSIS:  Right ring to right long cross finger flap.   POSTOPERATIVE DIAGNOSIS:  Right ring to right long cross finger flap.   PROCEDURE:  Division right ring to right long cross finger flap with  joint manipulations, right long and right ring finger.   SURGEON:  Artist Pais. Mina Marble, M.D.   ASSISTANT:  None.   ANESTHESIA:  General.   TOURNIQUET:  None.   COMPLICATIONS:  None.   DRAINS:  None.   OPERATIVE REPORT:  The patient taken to operating suite after induction  of adequate general anesthesia.  Right upper extremity was prepped  draped in usual sterile fashion.  Once this was done, the remaining  nylon sutures were removed from the two digits.  At this point in time a  skin flap between the long and ring finger was divided.  The wounds were  irrigated.  They were inset with 4-0 Vicryl Rapide.  Joint manipulations  were undertaken with gentle manipulation of the PIP joints both fingers.  They were then dressed with Xeroform, 4x4s and Coban wrap.  The patient  also had the suture removed from his donor graft site at the wrist  crease volarly.  The patient tolerated procedures well and went recovery  room stable fashion.      Artist Pais Mina Marble, M.D.  Electronically Signed     MAW/MEDQ  D:  10/08/2007  T:  10/08/2007  Job:  [id number]

## 2011-03-23 NOTE — Op Note (Signed)
[place]  Patient:    Daniel Bonilla, Daniel Bonilla Visit Number: [id number] MRN: [id number]          Service Type: Attending:  Carlisle Beers. Dorothyann Gibbs, M.D. Dictated by:   Carlisle Beers. Dorothyann Gibbs, M.D. Proc. Date: 11/01/01                             Operative Report  PREOPERATIVE DIAGNOSIS:  Displaced fracture distal tibia and fibula.  SURGICAL PROCEDURE:  Manipulation and open reduction internal fixation of distal tibia and fibula.  POSTOPERATIVE DIAGNOSIS:  Displaced fracture distal tibia and fibula.  SURGEON:  John L. Dorothyann Gibbs, M.D.  ASSISTANT:  Arnoldo Morale, P.A.  ANESTHESIA:  General.  PATHOLOGY:  The patient has an abduction type of fracture of the distal tibia about 2 cm above the plafond.  It is displaced into valgus position posteriorly.  The fibula has a syndesmosis disruption distally, and a fracture at a level about 1.5 inch above the plafond.  DESCRIPTION OF PROCEDURE:  Under general anesthesia, the left leg was first manipulated, and a C-arm picture revealed dramatically improved position and alignment.  It was then shaved and prepared with Betadine and draped as a sterile field.  It was wrapped out with the Esmarch, and the tourniquet was used at 350 mm.  An approximate 5 inch lateral incision was made exposing the fibular fracture in the center of it.  This was then manipulated to an almost satisfactory position.  The distal tibial fracture was then exposed medially by gentle manipulation, it was possible to reduce it in what appeared to be anatomic position on the medial side.  It was then possible to place two cannulated screw guide wires up the medial malleolus across the fracture, and 54 and 56 4.5 cannulated cancellous screws were then placed across this fracture maintaining near anatomic position of the distal tibia.  A 7 hole plate was then placed on the lateral malleolar fracture after some further manipulation.  At the second from  the bottom screw was a lag screw, 4.5 mm cannulated distal lag screw, repairing the syndesmosis defect.  The others were standard cortical screws.  Pictures revealed excellent position and alignment to all of the above.  The wounds were then irrigated and closed in layers with 2-0 Vicryl and skin clips.  Tourniquet was let down at 54 minutes. Sterile dressing and a short leg splint were applied.  The patient returned to recovery in good condition. Dictated by:   Carlisle Beers. Dorothyann Gibbs, M.D. Attending:  Carlisle Beers. Dorothyann Gibbs, M.D. DD:  11/01/01 TD:  11/02/01 Job: [id number]

## 2011-08-13 LAB — POCT HEMOGLOBIN-HEMACUE
Hemoglobin: 15.8
Operator id: 128471

## 2012-01-08 ENCOUNTER — Other Ambulatory Visit: Payer: Self-pay | Admitting: Cardiology

## 2012-01-08 MED ORDER — METOPROLOL TARTRATE 50 MG PO TABS: 50.0000 mg | ORAL_TABLET | Freq: Two times a day (BID) | ORAL | Status: DC

## 2012-01-08 MED ORDER — CLOPIDOGREL BISULFATE 75 MG PO TABS: 75.0000 mg | ORAL_TABLET | Freq: Every day | ORAL | Status: DC

## 2012-01-08 MED ORDER — ROSUVASTATIN CALCIUM 40 MG PO TABS: 40.0000 mg | ORAL_TABLET | Freq: Every day | ORAL | Status: DC

## 2012-01-08 NOTE — Telephone Encounter (Signed)
New Refill   Patient refilling RX, Yrly appnt scheduled for 03/05/12.  Verified Pharmacy as [facility], [address] [place], [place]

## 2012-03-05 ENCOUNTER — Ambulatory Visit: Payer: BC Managed Care – PPO | Admitting: Cardiology

## 2012-03-27 ENCOUNTER — Encounter: Payer: Self-pay | Admitting: Cardiology

## 2012-03-27 ENCOUNTER — Ambulatory Visit (INDEPENDENT_AMBULATORY_CARE_PROVIDER_SITE_OTHER): Payer: BC Managed Care – PPO | Admitting: Cardiology

## 2012-03-27 DIAGNOSIS — R079 Chest pain, unspecified: Secondary | ICD-10-CM

## 2012-03-27 DIAGNOSIS — I2510 Atherosclerotic heart disease of native coronary artery without angina pectoris: Secondary | ICD-10-CM

## 2012-03-27 DIAGNOSIS — R0683 Snoring: Secondary | ICD-10-CM

## 2012-03-27 DIAGNOSIS — F17200 Nicotine dependence, unspecified, uncomplicated: Secondary | ICD-10-CM

## 2012-03-27 DIAGNOSIS — R0989 Other specified symptoms and signs involving the circulatory and respiratory systems: Secondary | ICD-10-CM

## 2012-03-27 DIAGNOSIS — I10 Essential (primary) hypertension: Secondary | ICD-10-CM

## 2012-03-27 DIAGNOSIS — E785 Hyperlipidemia, unspecified: Secondary | ICD-10-CM

## 2012-03-27 DIAGNOSIS — R0609 Other forms of dyspnea: Secondary | ICD-10-CM

## 2012-03-27 NOTE — Patient Instructions (Signed)
We will schedule you for overnight oximetry  We will schedule you for a nuclear stress test with fasting lab work.

## 2012-03-31 DIAGNOSIS — R0683 Snoring: Secondary | ICD-10-CM | POA: Insufficient documentation

## 2012-03-31 NOTE — Assessment & Plan Note (Signed)
He has maintained smoking cessation.

## 2012-03-31 NOTE — Assessment & Plan Note (Signed)
He is overdue for evaluation of his lipids. We will schedule him for fasting chemistries and lipid panel.

## 2012-03-31 NOTE — Assessment & Plan Note (Signed)
Blood pressure control is acceptable on metoprolol.

## 2012-03-31 NOTE — Assessment & Plan Note (Signed)
We will have the patient wear overnight oximetry to screen for obstructive sleep apnea. If abnormal he may need a formal sleep study.

## 2012-03-31 NOTE — Assessment & Plan Note (Signed)
He is now year and a half out from his myocardial infarction. He is experiencing symptoms of chest tightness concerning for angina. We will schedule him for a stress nuclear study. He will continue aspirin, Plavix, metoprolol, and statin therapy. Cardiac catheterization is abnormal we may need to consider reevaluation with cardiac catheterization.

## 2012-03-31 NOTE — Progress Notes (Signed)
Daniel Bonilla Date of Birth: 24-Sep-1956   History of Present Illness: Daniel Bonilla is seen for followup today. He is status post inferior lateral myocardial infarction in February of 2012. He had stenting of the distal circumflex which was a dominant vessel with a bare-metal stent. He had residual 99% stenosis in the distal LAD. He presented with chest tightness and syncope. He currently reports that he gets tight in his chest at times. This usually occurs when he is working or under periods of stress. He has not taken nitroglycerin. There is no significant pattern to his symptoms. His family reports that he snores loudly and are concerned that he has sleep apnea. He is unaware of any problems with sleeping. He states that he is not smoking.  Current Outpatient Prescriptions on File Prior to Visit  Medication Sig Dispense Refill  . aspirin 81 MG tablet Take 81 mg by mouth daily.        . clopidogrel (PLAVIX) 75 MG tablet Take 1 tablet (75 mg total) by mouth daily.  30 tablet  5  . ibuprofen (ADVIL,MOTRIN) 200 MG tablet Take 200 mg by mouth every 6 (six) hours as needed.        . metoprolol (LOPRESSOR) 50 MG tablet Take 1 tablet (50 mg total) by mouth 2 (two) times daily.  60 tablet  5  . nitroGLYCERIN (NITROSTAT) 0.4 MG SL tablet Place 0.4 mg under the tongue every 5 (five) minutes as needed.        . rosuvastatin (CRESTOR) 40 MG tablet Take 1 tablet (40 mg total) by mouth daily.  30 tablet  5    No Known Allergies  Past Medical History  Diagnosis Date  . Tobacco dependence   . Dyslipidemia   . Hyperlipidemia   . Myocardial infarction of inferolateral wall 2/12    stent of dominant LCX with BMS    Past Surgical History  Procedure Date  . Ankle fracture surgery   . Basal cell carcinoma excision     eyelid, nose  . Neck surgery   . Hand surgery     History  Smoking status  . Former Smoker  . Quit date: 12/09/2010  Smokeless tobacco  . Not on file    History  Alcohol Use No     History reviewed. No pertinent family history.  Review of Systems: The review of systems is positive for mild intermittent chest tightness.  All other systems were reviewed and are negative.  Physical Exam: BP 131/84  Pulse 90  Ht 5\' 8"  (1.727 m)  Wt 183 lb (83.008 kg)  BMI 27.82 kg/m2 The patient is alert and oriented x 3.  The skin is warm and dry.   The HEENT exam reveals that the sclera are nonicteric.  The mucous membranes are moist.  The carotids are 2+ without bruits.  There is no thyromegaly.  There is no JVD.  The lungs are clear.    The heart exam reveals a regular rate with a normal S1 and S2.  There are no murmurs, gallops, or rubs.  The PMI is not displaced.   Abdominal exam reveals good bowel sounds.  There is no guarding or rebound.  There is no hepatosplenomegaly or tenderness.  There are no masses.  Exam of the legs reveal no clubbing, cyanosis, or edema.  The legs are without rashes.  The distal pulses are intact.  Cranial nerves II - XII are intact.  Motor and sensory functions are intact.  The gait  is normal.  LABORATORY DATA: ECG demonstrates normal sinus rhythm with evidence of inferior infarct, old. This includes Q waves in leads 2, 3, and aVF.  Assessment / Plan:

## 2012-04-03 ENCOUNTER — Other Ambulatory Visit: Payer: Self-pay

## 2012-04-03 ENCOUNTER — Ambulatory Visit (HOSPITAL_COMMUNITY): Payer: BC Managed Care – PPO | Attending: Cardiovascular Disease | Admitting: Radiology

## 2012-04-03 ENCOUNTER — Other Ambulatory Visit (INDEPENDENT_AMBULATORY_CARE_PROVIDER_SITE_OTHER): Payer: BC Managed Care – PPO

## 2012-04-03 ENCOUNTER — Ambulatory Visit
Admission: RE | Admit: 2012-04-03 | Discharge: 2012-04-03 | Disposition: A | Discharge status: Home / Self Care | Payer: BC Managed Care – PPO | Source: Ambulatory Visit | Attending: Cardiology | Admitting: Cardiology

## 2012-04-03 VITALS — BP 137/90 | Ht 68.0 in | Wt 180.0 lb

## 2012-04-03 DIAGNOSIS — R0683 Snoring: Secondary | ICD-10-CM

## 2012-04-03 DIAGNOSIS — R079 Chest pain, unspecified: Secondary | ICD-10-CM | POA: Insufficient documentation

## 2012-04-03 DIAGNOSIS — E785 Hyperlipidemia, unspecified: Secondary | ICD-10-CM | POA: Insufficient documentation

## 2012-04-03 DIAGNOSIS — I2510 Atherosclerotic heart disease of native coronary artery without angina pectoris: Secondary | ICD-10-CM

## 2012-04-03 DIAGNOSIS — Z87891 Personal history of nicotine dependence: Secondary | ICD-10-CM | POA: Insufficient documentation

## 2012-04-03 DIAGNOSIS — I252 Old myocardial infarction: Secondary | ICD-10-CM | POA: Insufficient documentation

## 2012-04-03 DIAGNOSIS — R0609 Other forms of dyspnea: Secondary | ICD-10-CM

## 2012-04-03 DIAGNOSIS — I10 Essential (primary) hypertension: Secondary | ICD-10-CM | POA: Insufficient documentation

## 2012-04-03 DIAGNOSIS — R0989 Other specified symptoms and signs involving the circulatory and respiratory systems: Secondary | ICD-10-CM | POA: Insufficient documentation

## 2012-04-03 LAB — BASIC METABOLIC PANEL
BUN: 13 mg/dL (ref 6–23)
Chloride: 111 mEq/L (ref 96–112)
Glucose, Bld: 96 mg/dL (ref 70–99)
Potassium: 4.1 mEq/L (ref 3.5–5.1)

## 2012-04-03 LAB — HEPATIC FUNCTION PANEL
ALT: 27 U/L (ref 0–53)
AST: 26 U/L (ref 0–37)
Albumin: 3.7 g/dL (ref 3.5–5.2)
Total Protein: 6.5 g/dL (ref 6.0–8.3)

## 2012-04-03 LAB — LIPID PANEL: Cholesterol: 115 mg/dL (ref 0–200)

## 2012-04-03 NOTE — Progress Notes (Signed)
[facility] SITE 3 NUCLEAR MED [address] [place] [address] [phone]  Cardiology Nuclear Med Study  Daniel Bonilla is a 56 y.o. male     MRN : [id number]     DOB: [birth year]  Procedure Date: 04/03/2012  Nuclear Med Background Indication for Stress Test:  Evaluation for Ischemia and Stent Patency History:  02/12 MI: [place]-Heart Cath-Stents: Dist CFX risidual in distal LAD 99% Cardiac Risk Factors: History of Smoking, Hypertension and Lipids  Symptoms:  Chest Tightness (last date of chest discomfort today)   Nuclear Pre-Procedure Caffeine/Decaff Intake:  None > 12 hrs NPO After: 11:30pm   Lungs:  clear O2 Sat: 97% on room air. IV 0.9% NS with Angio Cath:  22g  IV Site: L Antecubital  IV Started by:  Irean Hong, RN  Chest Size (in):  44 Cup Size: n/a  Height: 5\' 8"  (1.727 m)  Weight:  180 lb (81.647 kg)  BMI:  Body mass index is 27.37 kg/(m^2). Tech Comments:  Held lopressor x 24 hrs    Nuclear Med Study 1 or 2 day study: 1 day  Stress Test Type:  Stress  Reading MD: Charlton Haws, MD  Order Authorizing Provider:  Tedford Berg [place], MD  Resting Radionuclide: Technetium 33m Tetrofosmin  Resting Radionuclide Dose: 11.0 mCi   Stress Radionuclide:  Technetium 80m Tetrofosmin  Stress Radionuclide Dose: 33.0 mCi           Stress Protocol Rest HR: 86 Stress HR: 151  Rest BP: 169 Stress BP: 169/97  Exercise Time (min): 9:30 METS: 10.90   Predicted Max HR: 164 bpm % Max HR: 92.07 bpm Rate Pressure Product: 10272   Dose of Adenosine (mg):  n/a Dose of Lexiscan: n/a mg  Dose of Atropine (mg): n/a Dose of Dobutamine: n/a mcg/kg/min (at max HR)  Stress Test Technologist: Milana Na, EMT-P  Nuclear Technologist:  Domenic Polite, CNMT     Rest Procedure:  Myocardial perfusion imaging was performed at rest 45 minutes following the intravenous administration of Technetium 89m Tetrofosmin. Rest ECG: NSR - Normal EKG  Stress Procedure:  The patient  performed treadmill exercise using a Bruce  Protocol for 9:30 minutes. The patient stopped due to fatigue and chest tightness.  There were no significant ST-T wave changes, + chest tightness, and rare pvcs with exercise.  Technetium 61m Tetrofosmin was injected at peak exercise and myocardial perfusion imaging was performed after a brief delay. Stress ECG: No significant change from baseline ECG  QPS Raw Data Images:  Normal; no motion artifact; normal heart/lung ratio. Stress Images:  There is decreased uptake in the inferior wall. Rest Images:  There is decreased uptake in the inferior wall. Subtraction (SDS):  These findings are consistent with ischemia. Transient Ischemic Dilatation (Normal <1.22): 0.82 Lung/Heart Ratio (Normal <0.45):  0.22  Quantitative Gated Spect Images QGS EDV:  77 ml QGS ESV:  31 ml  Impression Exercise Capacity:  Fair exercise capacity. BP Response:  Normal blood pressure response. Clinical Symptoms:  Angina ECG Impression:  No significant ST segment change suggestive of ischemia. Comparison with Prior Nuclear Study: No images to compare  Overall Impression:  Small inferior wall infarct at mid and apical leve with mild to moderate periinfarct ischemia.  Angina with stress   LV Ejection Fraction: 60%.  LV Wall Motion:  NL LV Function; NL Wall Motion   Charlton Haws

## 2012-04-04 ENCOUNTER — Other Ambulatory Visit: Payer: Self-pay | Admitting: Cardiology

## 2012-04-04 DIAGNOSIS — I209 Angina pectoris, unspecified: Secondary | ICD-10-CM

## 2012-04-04 LAB — PROTIME-INR: Prothrombin Time: 11.1 s (ref 10.2–12.4)

## 2012-04-04 LAB — CBC WITH DIFFERENTIAL/PLATELET
Eosinophils Relative: 1.1 % (ref 0.0–5.0)
HCT: 46.4 % (ref 39.0–52.0)
Hemoglobin: 15.4 g/dL (ref 13.0–17.0)
Lymphs Abs: 2 10*3/uL (ref 0.7–4.0)
MCV: 89 fl (ref 78.0–100.0)
Monocytes Absolute: 0.9 10*3/uL (ref 0.1–1.0)
Neutro Abs: 7.4 10*3/uL (ref 1.4–7.7)
Platelets: 286 10*3/uL (ref 150.0–400.0)
RDW: 13.2 % (ref 11.5–14.6)
WBC: 10.5 10*3/uL (ref 4.5–10.5)

## 2012-04-11 ENCOUNTER — Encounter (HOSPITAL_BASED_OUTPATIENT_CLINIC_OR_DEPARTMENT_OTHER)
Admission: RE | Disposition: A | Discharge status: Home / Self Care | Payer: Self-pay | Source: Ambulatory Visit | Attending: Cardiology

## 2012-04-11 ENCOUNTER — Inpatient Hospital Stay (HOSPITAL_BASED_OUTPATIENT_CLINIC_OR_DEPARTMENT_OTHER)
Admission: RE | Admit: 2012-04-11 | Discharge: 2012-04-11 | Disposition: A | Discharge status: Home / Self Care | Payer: BC Managed Care – PPO | Source: Ambulatory Visit | Attending: Cardiology | Admitting: Cardiology

## 2012-04-11 DIAGNOSIS — F17200 Nicotine dependence, unspecified, uncomplicated: Secondary | ICD-10-CM | POA: Insufficient documentation

## 2012-04-11 DIAGNOSIS — I2510 Atherosclerotic heart disease of native coronary artery without angina pectoris: Secondary | ICD-10-CM | POA: Insufficient documentation

## 2012-04-11 DIAGNOSIS — I2119 ST elevation (STEMI) myocardial infarction involving other coronary artery of inferior wall: Secondary | ICD-10-CM

## 2012-04-11 DIAGNOSIS — E785 Hyperlipidemia, unspecified: Secondary | ICD-10-CM | POA: Insufficient documentation

## 2012-04-11 DIAGNOSIS — I252 Old myocardial infarction: Secondary | ICD-10-CM | POA: Insufficient documentation

## 2012-04-11 DIAGNOSIS — I209 Angina pectoris, unspecified: Secondary | ICD-10-CM

## 2012-04-11 DIAGNOSIS — Z9861 Coronary angioplasty status: Secondary | ICD-10-CM | POA: Insufficient documentation

## 2012-04-11 SURGERY — JV LEFT HEART CATHETERIZATION WITH CORONARY ANGIOGRAM
Anesthesia: Moderate Sedation

## 2012-04-11 MED ORDER — SODIUM CHLORIDE 0.9 % IV SOLN
INTRAVENOUS | Status: DC
  Administered 2012-04-11: 11:00:00 via INTRAVENOUS

## 2012-04-11 MED ORDER — DIAZEPAM 5 MG PO TABS
5.0000 mg | ORAL_TABLET | ORAL | Status: AC
  Administered 2012-04-11: 5 mg via ORAL

## 2012-04-11 MED ORDER — SODIUM CHLORIDE 0.9 % IJ SOLN: 3.0000 mL | INTRAMUSCULAR | Status: DC | PRN

## 2012-04-11 MED ORDER — ACETAMINOPHEN 325 MG PO TABS: 650.0000 mg | ORAL_TABLET | ORAL | Status: DC | PRN

## 2012-04-11 MED ORDER — ONDANSETRON HCL 4 MG/2ML IJ SOLN: 4.0000 mg | Freq: Four times a day (QID) | INTRAMUSCULAR | Status: DC | PRN

## 2012-04-11 MED ORDER — SODIUM CHLORIDE 0.9 % IV SOLN: 250.0000 mL | INTRAVENOUS | Status: DC | PRN

## 2012-04-11 MED ORDER — SODIUM CHLORIDE 0.9 % IV SOLN: 1.0000 mL/kg/h | INTRAVENOUS | Status: DC

## 2012-04-11 MED ORDER — SODIUM CHLORIDE 0.9 % IJ SOLN: 3.0000 mL | Freq: Two times a day (BID) | INTRAMUSCULAR | Status: DC

## 2012-04-11 MED ORDER — ASPIRIN 81 MG PO CHEW
324.0000 mg | CHEWABLE_TABLET | ORAL | Status: AC
  Administered 2012-04-11: 324 mg via ORAL

## 2012-04-11 NOTE — Progress Notes (Signed)
Bedrest begins @ 1420. 

## 2012-04-11 NOTE — Interval H&P Note (Signed)
History and Physical Interval Note:  04/11/2012 1:23 PM  Daniel Bonilla  has presented today for surgery, with the diagnosis of abn myoview  The various methods of treatment have been discussed with the patient and family. After consideration of risks, benefits and other options for treatment, the patient has consented to  Procedure(s) (LRB): JV LEFT HEART CATHETERIZATION WITH CORONARY ANGIOGRAM (N/A) as a surgical intervention .  The patients' history has been reviewed, patient examined, no change in status, stable for surgery.  I have reviewed the patients' chart and labs.  Questions were answered to the patient's satisfaction.     Theron Arista [facility] 04/11/2012 1:24 PM

## 2012-04-11 NOTE — H&P (View-Only) (Signed)
 Daniel Bonilla Date of Birth: 01/14/1956   History of Present Illness: Daniel Bonilla is seen for followup today. He is status post inferior lateral myocardial infarction in February of 2012. He had stenting of the distal circumflex which was a dominant vessel with a bare-metal stent. He had residual 99% stenosis in the distal LAD. He presented with chest tightness and syncope. He currently reports that he gets tight in his chest at times. This usually occurs when he is working or under periods of stress. He has not taken nitroglycerin. There is no significant pattern to his symptoms. His family reports that he snores loudly and are concerned that he has sleep apnea. He is unaware of any problems with sleeping. He states that he is not smoking.  Current Outpatient Prescriptions on File Prior to Visit  Medication Sig Dispense Refill  . aspirin 81 MG tablet Take 81 mg by mouth daily.        . clopidogrel (PLAVIX) 75 MG tablet Take 1 tablet (75 mg total) by mouth daily.  30 tablet  5  . ibuprofen (ADVIL,MOTRIN) 200 MG tablet Take 200 mg by mouth every 6 (six) hours as needed.        . metoprolol (LOPRESSOR) 50 MG tablet Take 1 tablet (50 mg total) by mouth 2 (two) times daily.  60 tablet  5  . nitroGLYCERIN (NITROSTAT) 0.4 MG SL tablet Place 0.4 mg under the tongue every 5 (five) minutes as needed.        . rosuvastatin (CRESTOR) 40 MG tablet Take 1 tablet (40 mg total) by mouth daily.  30 tablet  5    No Known Allergies  Past Medical History  Diagnosis Date  . Tobacco dependence   . Dyslipidemia   . Hyperlipidemia   . Myocardial infarction of inferolateral wall 2/12    stent of dominant LCX with BMS    Past Surgical History  Procedure Date  . Ankle fracture surgery   . Basal cell carcinoma excision     eyelid, nose  . Neck surgery   . Hand surgery     History  Smoking status  . Former Smoker  . Quit date: 12/09/2010  Smokeless tobacco  . Not on file    History  Alcohol Use No     History reviewed. No pertinent family history.  Review of Systems: The review of systems is positive for mild intermittent chest tightness.  All other systems were reviewed and are negative.  Physical Exam: BP 131/84  Pulse 90  Ht 5' 8" (1.727 m)  Wt 183 lb (83.008 kg)  BMI 27.82 kg/m2 The patient is alert and oriented x 3.  The skin is warm and dry.   The HEENT exam reveals that the sclera are nonicteric.  The mucous membranes are moist.  The carotids are 2+ without bruits.  There is no thyromegaly.  There is no JVD.  The lungs are clear.    The heart exam reveals a regular rate with a normal S1 and S2.  There are no murmurs, gallops, or rubs.  The PMI is not displaced.   Abdominal exam reveals good bowel sounds.  There is no guarding or rebound.  There is no hepatosplenomegaly or tenderness.  There are no masses.  Exam of the legs reveal no clubbing, cyanosis, or edema.  The legs are without rashes.  The distal pulses are intact.  Cranial nerves II - XII are intact.  Motor and sensory functions are intact.  The gait   is normal.  LABORATORY DATA: ECG demonstrates normal sinus rhythm with evidence of inferior infarct, old. This includes Q waves in leads 2, 3, and aVF.  Assessment / Plan:  

## 2012-04-11 NOTE — CV Procedure (Signed)
   Cardiac Catheterization Procedure Note  Name: Daniel Bonilla MRN: [id number] DOB: 1955-12-27  Procedure: Left Heart Cath, Selective Coronary Angiography, LV angiography  Indication: 56 year old white male with history of prior myocardial infarction and stenting of the distal left circumflex. He presents with symptoms of dyspnea on exertion. Stress Myoview study shows evidence of inferior lateral ischemia.   Procedural details: The right groin was prepped, draped, and anesthetized with 1% lidocaine. Using modified Seldinger technique, a 4 French sheath was introduced into the right femoral artery. Standard Judkins catheters were used for coronary angiography and left ventriculography. Catheter exchanges were performed over a guidewire. There were no immediate procedural complications. The patient was transferred to the post catheterization recovery area for further monitoring.  Procedural Findings: Hemodynamics:  AO 117/77 with a mean of 94 mmHg LV 115/22 mmHg   Coronary angiography: Coronary dominance: Left  Left mainstem: Normal.  Left anterior descending (LAD): Normal.  Left circumflex (LCx): This is a dominant vessel. There is no significant atherosclerotic disease. There is a stent in the distal vessel that is widely patent. There is a moderate sized intermediate branch which is also normal.  Right coronary artery (RCA): Is a very small nondominant vessel and is normal.  Left ventriculography: Left ventricular systolic function is abnormal. There is inferior apical and apical hypokinesis. Overall left ventricular systolic function is mildly reduced with ejection fraction estimated at 50%.  Final Conclusions:   1. Nonobstructive coronary disease. The stent in the distal circumflex is widely patent. 2. Mild left ventricular dysfunction with inferior apical wall motion abnormality.  Recommendations: Continue medical management.  Theron Arista [facility] 04/11/2012, 1:59 PM

## 2012-04-25 ENCOUNTER — Encounter: Payer: Self-pay | Admitting: Nurse Practitioner

## 2012-04-25 ENCOUNTER — Ambulatory Visit (INDEPENDENT_AMBULATORY_CARE_PROVIDER_SITE_OTHER): Payer: BC Managed Care – PPO | Admitting: Nurse Practitioner

## 2012-04-25 VITALS — BP 128/86 | HR 80 | Ht 69.0 in | Wt 188.6 lb

## 2012-04-25 DIAGNOSIS — F17200 Nicotine dependence, unspecified, uncomplicated: Secondary | ICD-10-CM

## 2012-04-25 DIAGNOSIS — I2510 Atherosclerotic heart disease of native coronary artery without angina pectoris: Secondary | ICD-10-CM

## 2012-04-25 DIAGNOSIS — I10 Essential (primary) hypertension: Secondary | ICD-10-CM

## 2012-04-25 NOTE — Assessment & Plan Note (Signed)
Blood pressure is quite good and even lower at home.

## 2012-04-25 NOTE — Assessment & Plan Note (Signed)
He is not smoking 

## 2012-04-25 NOTE — Assessment & Plan Note (Signed)
Has had recent cath showing patent stent. Will be managed medically. Has very mild LV dysfunction. Currently not on ACE. Blood pressure running much lower at home. May need to consider in the future. I will have Dr. Swaziland see him back in 4 months. Patient is agreeable to this plan and will call if any problems develop in the interim.

## 2012-04-25 NOTE — Progress Notes (Signed)
Daniel Bonilla Date of Birth: 1956-04-06 Medical Record #[id number]  History of Present Illness: Daniel Bonilla is seen back today for a post cath visit. He is seen for Dr. Swaziland. He has known CAD with prior lateral MI in February of 2012 with BMS to the distal LCX. Has had recent cath following an abnormal Myoview, showing that his stent is patent. Mild LV dysfunction with an EF of 50% was noted. He will continue with medical management. Other problems include HLD, HTN and past tobacco abuse. There has been concern in the past for sleep apnea due to loud snoring.   He comes in today. He is here alone. He is doing well. No problems with his groin. Thinks most of his issues are stress related and he is going to talk with his PCP. Has not had his overnight oximetry yet. Tolerating his medicines. Blood pressure at home much lower at 110 to 115 systolic. He continues to not smoke.   Current Outpatient Prescriptions on File Prior to Visit  Medication Sig Dispense Refill  . aspirin 81 MG tablet Take 81 mg by mouth daily.        . clopidogrel (PLAVIX) 75 MG tablet Take 1 tablet (75 mg total) by mouth daily.  30 tablet  5  . ibuprofen (ADVIL,MOTRIN) 200 MG tablet Take 200 mg by mouth every 6 (six) hours as needed.        . metoprolol (LOPRESSOR) 50 MG tablet Take 1 tablet (50 mg total) by mouth 2 (two) times daily.  60 tablet  5  . nitroGLYCERIN (NITROSTAT) 0.4 MG SL tablet Place 0.4 mg under the tongue every 5 (five) minutes as needed.        . rosuvastatin (CRESTOR) 40 MG tablet Take 1 tablet (40 mg total) by mouth daily.  30 tablet  5    No Known Allergies  Past Medical History  Diagnosis Date  . Tobacco dependence   . Dyslipidemia   . Hyperlipidemia   . CAD (coronary artery disease) 2/12    prior lateral MI in February of 2012 with distal LCX with BMS and 99% distal LAD. S/P cath in June 2013 - stent is patent, mild LV dysfunction with EF of 50% - managed medically.   Marland Kitchen HTN (hypertension)   .  Snoring     Past Surgical History  Procedure Date  . Ankle fracture surgery   . Basal cell carcinoma excision     eyelid, nose  . Neck surgery   . Hand surgery     History  Smoking status  . Former Smoker  . Quit date: 12/09/2010  Smokeless tobacco  . Not on file    History  Alcohol Use No    History reviewed. No pertinent family history.  Review of Systems: The review of systems is per the HPI.  All other systems were reviewed and are negative.  Physical Exam: BP 128/86  Pulse 80  Ht 5\' 9"  (1.753 m)  Wt 188 lb 9.6 oz (85.548 kg)  BMI 27.85 kg/m2 Patient is very pleasant and in no acute distress. Skin is warm and dry. Color is normal.  HEENT is unremarkable. Normocephalic/atraumatic. PERRL. Sclera are nonicteric. Neck is supple. No masses. No JVD. Lungs are clear. Cardiac exam shows a regular rate and rhythm. Abdomen is soft. Extremities are without edema. Gait and ROM are intact. No gross neurologic deficits noted.   LABORATORY DATA:  Lab Results  Component Value Date   WBC 10.5 04/03/2012  HGB 15.4 04/03/2012   HCT 46.4 04/03/2012   PLT 286.0 04/03/2012   GLUCOSE 96 04/03/2012   CHOL 115 04/03/2012   TRIG 108.0 04/03/2012   HDL 40.50 04/03/2012   LDLCALC 53 04/03/2012   ALT 27 04/03/2012   AST 26 04/03/2012   NA 142 04/03/2012   K 4.1 04/03/2012   CL 111 04/03/2012   CREATININE 1.0 04/03/2012   BUN 13 04/03/2012   CO2 25 04/03/2012   TSH 0.430 12/12/2010   INR 1.0 04/03/2012   HGBA1C  Value: 5.5 (NOTE)                                                                       According to the ADA Clinical Practice Recommendations for 2011, when HbA1c is used as a screening test:   >=6.5%   Diagnostic of Diabetes Mellitus           (if abnormal result  is confirmed)  5.7-6.4%   Increased risk of developing Diabetes Mellitus  References:Diagnosis and Classification of Diabetes Mellitus,Diabetes Care,2011,34(Suppl 1):S62-S69 and Standards of Medical Care in         Diabetes -  2011,Diabetes Care,2011,34  (Suppl 1):S11-S61. 12/12/2010     Assessment / Plan:

## 2012-04-25 NOTE — Patient Instructions (Addendum)
Stay on your current medicines.   Stay active.  We will see you back in 4 months with Dr. Swaziland.  Call the [facility] office at [phone] if you have any questions, problems or concerns.

## 2012-04-27 ENCOUNTER — Encounter: Payer: Self-pay | Admitting: Cardiology

## 2012-05-01 ENCOUNTER — Telehealth: Payer: Self-pay

## 2012-05-01 NOTE — Telephone Encounter (Signed)
Patient called no answer.Left message for patient to call back for oximetry test results.

## 2012-05-01 NOTE — Telephone Encounter (Signed)
Patient called no answer.Left message to call back to get

## 2012-05-02 ENCOUNTER — Telehealth: Payer: Self-pay

## 2012-05-02 NOTE — Telephone Encounter (Signed)
Patient called was told pulse oximetry test was mildly abnormal.Dr.Jordan advises to continue same medication follow up with him in 10/13.Call sooner if needed.

## 2012-10-27 ENCOUNTER — Other Ambulatory Visit: Payer: Self-pay | Admitting: *Deleted

## 2012-10-27 MED ORDER — CLOPIDOGREL BISULFATE 75 MG PO TABS: 75.0000 mg | ORAL_TABLET | Freq: Every day | ORAL | Status: DC

## 2012-11-04 ENCOUNTER — Other Ambulatory Visit: Payer: Self-pay

## 2012-11-04 MED ORDER — CLOPIDOGREL BISULFATE 75 MG PO TABS: 75.0000 mg | ORAL_TABLET | Freq: Every day | ORAL | Status: DC

## 2012-12-12 ENCOUNTER — Telehealth: Payer: Self-pay | Admitting: Cardiology

## 2012-12-12 NOTE — Telephone Encounter (Signed)
New Problem:    Patient called in needing a new prescription for his nitroGLYCERIN (NITROSTAT) 0.4 MG SL tablet sent into [facility].  [place] last bottle [place]

## 2013-01-16 ENCOUNTER — Other Ambulatory Visit: Payer: Self-pay

## 2013-01-16 MED ORDER — NITROGLYCERIN 0.4 MG SL SUBL: 0.4000 mg | SUBLINGUAL_TABLET | SUBLINGUAL | Status: DC | PRN

## 2013-11-27 ENCOUNTER — Ambulatory Visit
Admission: RE | Admit: 2013-11-27 | Discharge: 2013-11-27 | Disposition: A | Discharge status: Home / Self Care | Payer: BC Managed Care – PPO | Source: Ambulatory Visit | Attending: Physician Assistant | Admitting: Physician Assistant

## 2013-11-27 ENCOUNTER — Other Ambulatory Visit: Payer: Self-pay | Admitting: Physician Assistant

## 2013-11-27 DIAGNOSIS — R0602 Shortness of breath: Secondary | ICD-10-CM

## 2013-11-27 DIAGNOSIS — R509 Fever, unspecified: Secondary | ICD-10-CM

## 2013-12-30 ENCOUNTER — Ambulatory Visit
Admission: RE | Admit: 2013-12-30 | Discharge: 2013-12-30 | Disposition: A | Discharge status: Home / Self Care | Payer: BC Managed Care – PPO | Source: Ambulatory Visit | Attending: Physician Assistant | Admitting: Physician Assistant

## 2013-12-30 ENCOUNTER — Other Ambulatory Visit: Payer: Self-pay | Admitting: Physician Assistant

## 2013-12-30 DIAGNOSIS — J189 Pneumonia, unspecified organism: Secondary | ICD-10-CM

## 2014-02-04 ENCOUNTER — Other Ambulatory Visit: Payer: Self-pay | Admitting: Cardiology

## 2014-02-04 NOTE — Telephone Encounter (Signed)
Patient called no answer.Left message on personal voice mail received refill request need to call me back past due to see Dr.Jordan will need to schedule appointment.

## 2014-02-04 NOTE — Telephone Encounter (Signed)
Left message for patient to call me past due to see Dr.Jordan will need to schedule appointment.

## 2014-02-04 NOTE — Telephone Encounter (Signed)
Is this ok to refill? Patient not seen since 04/25/12 and the refill history is very inconsistant and he has not refilled it since 10/2013 and that was for only 30 pills. Please advise. Thanks, MI

## 2014-02-15 ENCOUNTER — Telehealth: Payer: Self-pay

## 2014-02-15 NOTE — Telephone Encounter (Signed)
Patient called no answer.Left message on personal voice mail need to call me back to schedule appointment with Dr.Jordan.

## 2014-05-02 ENCOUNTER — Encounter (HOSPITAL_COMMUNITY): Payer: BC Managed Care – PPO | Admitting: Anesthesiology

## 2014-05-02 ENCOUNTER — Emergency Department (HOSPITAL_COMMUNITY): Payer: BC Managed Care – PPO

## 2014-05-02 ENCOUNTER — Inpatient Hospital Stay (HOSPITAL_COMMUNITY)
Admission: EM | Admit: 2014-05-02 | Discharge: 2014-05-04 | DRG: 563 | Disposition: A | Discharge status: Home Health | Payer: BC Managed Care – PPO | Attending: Internal Medicine | Admitting: Internal Medicine

## 2014-05-02 ENCOUNTER — Inpatient Hospital Stay (HOSPITAL_COMMUNITY): Payer: BC Managed Care – PPO | Admitting: Anesthesiology

## 2014-05-02 ENCOUNTER — Encounter (HOSPITAL_COMMUNITY): Admission: EM | Disposition: A | Discharge status: Home Health | Payer: Self-pay | Source: Home / Self Care

## 2014-05-02 ENCOUNTER — Encounter (HOSPITAL_COMMUNITY): Payer: Self-pay | Admitting: Radiology

## 2014-05-02 DIAGNOSIS — S91009A Unspecified open wound, unspecified ankle, initial encounter: Secondary | ICD-10-CM

## 2014-05-02 DIAGNOSIS — S01309A Unspecified open wound of unspecified ear, initial encounter: Secondary | ICD-10-CM | POA: Diagnosis present

## 2014-05-02 DIAGNOSIS — S81809A Unspecified open wound, unspecified lower leg, initial encounter: Secondary | ICD-10-CM

## 2014-05-02 DIAGNOSIS — S20219A Contusion of unspecified front wall of thorax, initial encounter: Secondary | ICD-10-CM

## 2014-05-02 DIAGNOSIS — F1010 Alcohol abuse, uncomplicated: Secondary | ICD-10-CM | POA: Diagnosis present

## 2014-05-02 DIAGNOSIS — S01409A Unspecified open wound of unspecified cheek and temporomandibular area, initial encounter: Secondary | ICD-10-CM | POA: Diagnosis present

## 2014-05-02 DIAGNOSIS — S0120XA Unspecified open wound of nose, initial encounter: Secondary | ICD-10-CM | POA: Diagnosis present

## 2014-05-02 DIAGNOSIS — Z87891 Personal history of nicotine dependence: Secondary | ICD-10-CM

## 2014-05-02 DIAGNOSIS — J438 Other emphysema: Secondary | ICD-10-CM | POA: Diagnosis present

## 2014-05-02 DIAGNOSIS — S8263XB Displaced fracture of lateral malleolus of unspecified fibula, initial encounter for open fracture type I or II: Principal | ICD-10-CM | POA: Diagnosis present

## 2014-05-02 DIAGNOSIS — S92309A Fracture of unspecified metatarsal bone(s), unspecified foot, initial encounter for closed fracture: Secondary | ICD-10-CM | POA: Diagnosis present

## 2014-05-02 DIAGNOSIS — Z85828 Personal history of other malignant neoplasm of skin: Secondary | ICD-10-CM

## 2014-05-02 DIAGNOSIS — IMO0002 Reserved for concepts with insufficient information to code with codable children: Secondary | ICD-10-CM | POA: Diagnosis present

## 2014-05-02 DIAGNOSIS — D62 Acute posthemorrhagic anemia: Secondary | ICD-10-CM | POA: Diagnosis present

## 2014-05-02 DIAGNOSIS — I10 Essential (primary) hypertension: Secondary | ICD-10-CM | POA: Diagnosis present

## 2014-05-02 DIAGNOSIS — S83207A Unspecified tear of unspecified meniscus, current injury, left knee, initial encounter: Secondary | ICD-10-CM

## 2014-05-02 DIAGNOSIS — S81009A Unspecified open wound, unspecified knee, initial encounter: Secondary | ICD-10-CM | POA: Diagnosis present

## 2014-05-02 DIAGNOSIS — S0180XA Unspecified open wound of other part of head, initial encounter: Secondary | ICD-10-CM

## 2014-05-02 DIAGNOSIS — E785 Hyperlipidemia, unspecified: Secondary | ICD-10-CM | POA: Diagnosis present

## 2014-05-02 DIAGNOSIS — S83419A Sprain of medial collateral ligament of unspecified knee, initial encounter: Secondary | ICD-10-CM

## 2014-05-02 DIAGNOSIS — H0520 Unspecified exophthalmos: Secondary | ICD-10-CM | POA: Diagnosis present

## 2014-05-02 DIAGNOSIS — S83289A Other tear of lateral meniscus, current injury, unspecified knee, initial encounter: Secondary | ICD-10-CM | POA: Diagnosis present

## 2014-05-02 DIAGNOSIS — S83519A Sprain of anterior cruciate ligament of unspecified knee, initial encounter: Secondary | ICD-10-CM

## 2014-05-02 DIAGNOSIS — I2510 Atherosclerotic heart disease of native coronary artery without angina pectoris: Secondary | ICD-10-CM | POA: Diagnosis present

## 2014-05-02 DIAGNOSIS — S82409B Unspecified fracture of shaft of unspecified fibula, initial encounter for open fracture type I or II: Secondary | ICD-10-CM

## 2014-05-02 HISTORY — PX: INCISION AND DRAINAGE OF WOUND: SHX1803

## 2014-05-02 HISTORY — PX: I & D EXTREMITY: SHX5045

## 2014-05-02 HISTORY — DX: Other motorcycle driver injured in collision with unspecified motor vehicles in traffic accident, initial encounter: V29.408A

## 2014-05-02 LAB — COMPREHENSIVE METABOLIC PANEL
ALK PHOS: 98 U/L (ref 39–117)
ALT: 38 U/L (ref 0–53)
AST: 65 U/L — AB (ref 0–37)
Albumin: 3.2 g/dL — ABNORMAL LOW (ref 3.5–5.2)
BILIRUBIN TOTAL: 0.5 mg/dL (ref 0.3–1.2)
BUN: 13 mg/dL (ref 6–23)
CALCIUM: 8.6 mg/dL (ref 8.4–10.5)
CHLORIDE: 103 meq/L (ref 96–112)
CO2: 18 meq/L — AB (ref 19–32)
Creatinine, Ser: 1.04 mg/dL (ref 0.50–1.35)
GFR, EST AFRICAN AMERICAN: 90 mL/min — AB (ref >90)
GFR, EST NON AFRICAN AMERICAN: 77 mL/min — AB (ref >90)
GLUCOSE: 102 mg/dL — AB (ref 70–99)
Potassium: 3.6 mEq/L — ABNORMAL LOW (ref 3.7–5.3)
SODIUM: 140 meq/L (ref 137–147)
Total Protein: 7 g/dL (ref 6.0–8.3)

## 2014-05-02 LAB — TYPE AND SCREEN
ABO/RH(D): A POS
Antibody Screen: NEGATIVE
UNIT DIVISION: 0
Unit division: 0

## 2014-05-02 LAB — CBC
HCT: 41.7 % (ref 39.0–52.0)
Hemoglobin: 14.1 g/dL (ref 13.0–17.0)
MCH: 29.1 pg (ref 26.0–34.0)
MCHC: 33.8 g/dL (ref 30.0–36.0)
MCV: 86.2 fL (ref 78.0–100.0)
PLATELETS: 236 10*3/uL (ref 150–400)
RBC: 4.84 MIL/uL (ref 4.22–5.81)
RDW: 13.1 % (ref 11.5–15.5)
WBC: 17.3 10*3/uL — ABNORMAL HIGH (ref 4.0–10.5)

## 2014-05-02 LAB — PREPARE FRESH FROZEN PLASMA
Unit division: 0
Unit division: 0

## 2014-05-02 LAB — CDS SEROLOGY

## 2014-05-02 LAB — TROPONIN I

## 2014-05-02 LAB — ETHANOL: ALCOHOL ETHYL (B): 110 mg/dL — AB (ref 0–11)

## 2014-05-02 LAB — I-STAT CG4 LACTIC ACID, ED: Lactic Acid, Venous: 3.5 mmol/L — ABNORMAL HIGH (ref 0.5–2.2)

## 2014-05-02 LAB — PROTIME-INR
INR: 1.06 (ref 0.00–1.49)
PROTHROMBIN TIME: 13.8 s (ref 11.6–15.2)

## 2014-05-02 LAB — ABO/RH: ABO/RH(D): A POS

## 2014-05-02 SURGERY — IRRIGATION AND DEBRIDEMENT EXTREMITY
Anesthesia: General | Site: Mouth

## 2014-05-02 MED ORDER — CEFAZOLIN SODIUM 1-5 GM-% IV SOLN
1.0000 g | Freq: Once | INTRAVENOUS | Status: AC
  Administered 2014-05-02: 1 g via INTRAVENOUS
  Filled 2014-05-02: qty 50

## 2014-05-02 MED ORDER — PROPOFOL 10 MG/ML IV BOLUS
INTRAVENOUS | Status: DC | PRN
  Administered 2014-05-02: 140 mg via INTRAVENOUS

## 2014-05-02 MED ORDER — LIDOCAINE HCL (CARDIAC) 20 MG/ML IV SOLN
INTRAVENOUS | Status: AC
  Filled 2014-05-02: qty 5

## 2014-05-02 MED ORDER — LIDOCAINE-EPINEPHRINE 1 %-1:100000 IJ SOLN
INTRAMUSCULAR | Status: DC | PRN
Start: 2014-05-02 — End: 2014-05-02
  Administered 2014-05-02: 20 mL

## 2014-05-02 MED ORDER — HYDROMORPHONE HCL PF 1 MG/ML IJ SOLN
1.0000 mg | INTRAMUSCULAR | Status: DC | PRN
  Administered 2014-05-02 – 2014-05-04 (×3): 1 mg via INTRAVENOUS
  Filled 2014-05-02 (×3): qty 1

## 2014-05-02 MED ORDER — ALBUTEROL SULFATE HFA 108 (90 BASE) MCG/ACT IN AERS
INHALATION_SPRAY | RESPIRATORY_TRACT | Status: AC
  Filled 2014-05-02: qty 6.7

## 2014-05-02 MED ORDER — ONDANSETRON HCL 4 MG PO TABS
4.0000 mg | ORAL_TABLET | Freq: Four times a day (QID) | ORAL | Status: DC | PRN
  Administered 2014-05-02: 4 mg via ORAL
  Filled 2014-05-02: qty 1

## 2014-05-02 MED ORDER — PHENYLEPHRINE 40 MCG/ML (10ML) SYRINGE FOR IV PUSH (FOR BLOOD PRESSURE SUPPORT)
PREFILLED_SYRINGE | INTRAVENOUS | Status: AC
  Filled 2014-05-02: qty 10

## 2014-05-02 MED ORDER — FENTANYL CITRATE 0.05 MG/ML IJ SOLN
INTRAMUSCULAR | Status: DC | PRN
  Administered 2014-05-02 (×5): 50 ug via INTRAVENOUS

## 2014-05-02 MED ORDER — ENOXAPARIN SODIUM 40 MG/0.4ML ~~LOC~~ SOLN
40.0000 mg | SUBCUTANEOUS | Status: DC
  Administered 2014-05-03 – 2014-05-04 (×2): 40 mg via SUBCUTANEOUS
  Filled 2014-05-02 (×2): qty 0.4

## 2014-05-02 MED ORDER — HYDROMORPHONE HCL PF 1 MG/ML IJ SOLN: 1.0000 mg | INTRAMUSCULAR | Status: DC | PRN

## 2014-05-02 MED ORDER — SUCCINYLCHOLINE CHLORIDE 20 MG/ML IJ SOLN
INTRAMUSCULAR | Status: DC | PRN
  Administered 2014-05-02: 100 mg via INTRAVENOUS

## 2014-05-02 MED ORDER — LIDOCAINE-EPINEPHRINE 1 %-1:100000 IJ SOLN
INTRAMUSCULAR | Status: AC
  Filled 2014-05-02: qty 1

## 2014-05-02 MED ORDER — OXYCODONE HCL 5 MG PO TABS: 5.0000 mg | ORAL_TABLET | Freq: Once | ORAL | Status: DC | PRN

## 2014-05-02 MED ORDER — LIDOCAINE HCL (CARDIAC) 20 MG/ML IV SOLN
INTRAVENOUS | Status: DC | PRN
  Administered 2014-05-02: 60 mg via INTRAVENOUS

## 2014-05-02 MED ORDER — FENTANYL CITRATE 0.05 MG/ML IJ SOLN
INTRAMUSCULAR | Status: AC
  Filled 2014-05-02: qty 5

## 2014-05-02 MED ORDER — MIDAZOLAM HCL 2 MG/2ML IJ SOLN
INTRAMUSCULAR | Status: AC
  Filled 2014-05-02: qty 2

## 2014-05-02 MED ORDER — BSS IO SOLN
INTRAOCULAR | Status: DC | PRN
  Administered 2014-05-02: 15 mL via INTRAOCULAR

## 2014-05-02 MED ORDER — PANTOPRAZOLE SODIUM 40 MG PO TBEC
40.0000 mg | DELAYED_RELEASE_TABLET | Freq: Every day | ORAL | Status: DC
  Administered 2014-05-03 – 2014-05-04 (×3): 40 mg via ORAL
  Filled 2014-05-02 (×2): qty 1

## 2014-05-02 MED ORDER — MIDAZOLAM HCL 5 MG/5ML IJ SOLN
INTRAMUSCULAR | Status: DC | PRN
  Administered 2014-05-02: 2 mg via INTRAVENOUS

## 2014-05-02 MED ORDER — SODIUM CHLORIDE 0.9 % IV BOLUS (SEPSIS)
1000.0000 mL | Freq: Once | INTRAVENOUS | Status: AC
  Administered 2014-05-02: 1000 mL via INTRAVENOUS

## 2014-05-02 MED ORDER — SODIUM CHLORIDE 0.9 % IV SOLN
INTRAVENOUS | Status: AC | PRN
  Administered 2014-05-02: 999 mL/h via INTRAVENOUS

## 2014-05-02 MED ORDER — SODIUM CHLORIDE 0.9 % IR SOLN
Status: DC | PRN
  Administered 2014-05-02: 1000 mL
  Administered 2014-05-02: 3000 mL

## 2014-05-02 MED ORDER — 0.9 % SODIUM CHLORIDE (POUR BTL) OPTIME
TOPICAL | Status: DC | PRN
  Administered 2014-05-02: 1000 mL

## 2014-05-02 MED ORDER — ARTIFICIAL TEARS OP OINT
TOPICAL_OINTMENT | OPHTHALMIC | Status: AC
  Filled 2014-05-02: qty 3.5

## 2014-05-02 MED ORDER — CEFAZOLIN SODIUM-DEXTROSE 2-3 GM-% IV SOLR
INTRAVENOUS | Status: DC | PRN
  Administered 2014-05-02: 2 g via INTRAVENOUS

## 2014-05-02 MED ORDER — IOHEXOL 300 MG/ML  SOLN
100.0000 mL | Freq: Once | INTRAMUSCULAR | Status: AC | PRN
  Administered 2014-05-02: 100 mL via INTRAVENOUS

## 2014-05-02 MED ORDER — ONDANSETRON HCL 4 MG/2ML IJ SOLN
INTRAMUSCULAR | Status: AC
  Filled 2014-05-02: qty 2

## 2014-05-02 MED ORDER — PROPOFOL 10 MG/ML IV BOLUS
INTRAVENOUS | Status: AC
  Filled 2014-05-02: qty 20

## 2014-05-02 MED ORDER — METHOCARBAMOL 500 MG PO TABS
500.0000 mg | ORAL_TABLET | Freq: Four times a day (QID) | ORAL | Status: DC | PRN
  Administered 2014-05-02 – 2014-05-04 (×3): 500 mg via ORAL
  Filled 2014-05-02 (×3): qty 1

## 2014-05-02 MED ORDER — ONDANSETRON HCL 4 MG/2ML IJ SOLN
INTRAMUSCULAR | Status: DC | PRN
  Administered 2014-05-02: 4 mg via INTRAVENOUS

## 2014-05-02 MED ORDER — PANTOPRAZOLE SODIUM 40 MG IV SOLR
40.0000 mg | Freq: Every day | INTRAVENOUS | Status: DC
  Filled 2014-05-02: qty 40

## 2014-05-02 MED ORDER — SODIUM CHLORIDE 0.9 % IV SOLN
INTRAVENOUS | Status: DC | PRN
  Administered 2014-05-02 (×2): via INTRAVENOUS

## 2014-05-02 MED ORDER — BSS IO SOLN
INTRAOCULAR | Status: AC
  Filled 2014-05-02: qty 15

## 2014-05-02 MED ORDER — ARTIFICIAL TEARS OP OINT
TOPICAL_OINTMENT | OPHTHALMIC | Status: DC | PRN
  Administered 2014-05-02: 1 via OPHTHALMIC

## 2014-05-02 MED ORDER — ALBUTEROL SULFATE HFA 108 (90 BASE) MCG/ACT IN AERS
INHALATION_SPRAY | RESPIRATORY_TRACT | Status: DC | PRN
  Administered 2014-05-02 (×2): 2 via RESPIRATORY_TRACT

## 2014-05-02 MED ORDER — MORPHINE SULFATE 4 MG/ML IJ SOLN
4.0000 mg | Freq: Once | INTRAMUSCULAR | Status: AC
  Administered 2014-05-02: 4 mg via INTRAVENOUS
  Filled 2014-05-02: qty 1

## 2014-05-02 MED ORDER — OXYCODONE HCL 5 MG/5ML PO SOLN: 5.0000 mg | Freq: Once | ORAL | Status: DC | PRN

## 2014-05-02 MED ORDER — ONDANSETRON HCL 4 MG/2ML IJ SOLN: 4.0000 mg | Freq: Four times a day (QID) | INTRAMUSCULAR | Status: DC | PRN

## 2014-05-02 MED ORDER — OXYCODONE HCL 5 MG PO TABS
5.0000 mg | ORAL_TABLET | ORAL | Status: DC | PRN
  Administered 2014-05-02 – 2014-05-04 (×6): 10 mg via ORAL
  Filled 2014-05-02 (×7): qty 2

## 2014-05-02 MED ORDER — PHENYLEPHRINE HCL 10 MG/ML IJ SOLN
INTRAMUSCULAR | Status: DC | PRN
  Administered 2014-05-02 (×2): 80 ug via INTRAVENOUS
  Administered 2014-05-02: 40 ug via INTRAVENOUS
  Administered 2014-05-02 (×3): 80 ug via INTRAVENOUS
  Administered 2014-05-02: 40 ug via INTRAVENOUS

## 2014-05-02 MED ORDER — DOCUSATE SODIUM 100 MG PO CAPS
100.0000 mg | ORAL_CAPSULE | Freq: Two times a day (BID) | ORAL | Status: DC
  Administered 2014-05-02 – 2014-05-04 (×4): 100 mg via ORAL
  Filled 2014-05-02 (×5): qty 1

## 2014-05-02 MED ORDER — SUCCINYLCHOLINE CHLORIDE 20 MG/ML IJ SOLN
INTRAMUSCULAR | Status: AC
  Filled 2014-05-02: qty 1

## 2014-05-02 MED ORDER — POTASSIUM CHLORIDE IN NACL 20-0.9 MEQ/L-% IV SOLN
INTRAVENOUS | Status: DC
Start: 2014-05-02 — End: 2014-05-04
  Administered 2014-05-02: 22:00:00 via INTRAVENOUS
  Filled 2014-05-02 (×7): qty 1000

## 2014-05-02 MED ORDER — HYDROMORPHONE HCL PF 1 MG/ML IJ SOLN
0.2500 mg | INTRAMUSCULAR | Status: DC | PRN
  Administered 2014-05-02 (×3): 0.5 mg via INTRAVENOUS

## 2014-05-02 SURGICAL SUPPLY — 75 items
BAG URINE DRAINAGE (UROLOGICAL SUPPLIES) ×1 IMPLANT
BANDAGE ELASTIC 3 VELCRO ST LF (GAUZE/BANDAGES/DRESSINGS) IMPLANT
BANDAGE ELASTIC 4 VELCRO ST LF (GAUZE/BANDAGES/DRESSINGS) ×2 IMPLANT
BLADE SURG 10 STRL SS (BLADE) ×3 IMPLANT
BNDG COHESIVE 1X5 TAN STRL LF (GAUZE/BANDAGES/DRESSINGS) IMPLANT
BNDG COHESIVE 4X5 TAN STRL (GAUZE/BANDAGES/DRESSINGS) ×3 IMPLANT
BNDG COHESIVE 6X5 TAN STRL LF (GAUZE/BANDAGES/DRESSINGS) ×6 IMPLANT
BNDG CONFORM 3 STRL LF (GAUZE/BANDAGES/DRESSINGS) IMPLANT
BNDG GAUZE ELAST 4 BULKY (GAUZE/BANDAGES/DRESSINGS) ×2 IMPLANT
BNDG GAUZE STRTCH 6 (GAUZE/BANDAGES/DRESSINGS) ×9 IMPLANT
CORDS BIPOLAR (ELECTRODE) IMPLANT
COVER MAYO STAND STRL (DRAPES) ×2 IMPLANT
COVER SURGICAL LIGHT HANDLE (MISCELLANEOUS) ×3 IMPLANT
CUFF TOURNIQUET SINGLE 18IN (TOURNIQUET CUFF) ×2 IMPLANT
CUFF TOURNIQUET SINGLE 24IN (TOURNIQUET CUFF) IMPLANT
CUFF TOURNIQUET SINGLE 34IN LL (TOURNIQUET CUFF) IMPLANT
CUFF TOURNIQUET SINGLE 44IN (TOURNIQUET CUFF) IMPLANT
DRAPE ORTHO SPLIT 77X108 STRL (DRAPES) ×6
DRAPE SURG 17X23 STRL (DRAPES) IMPLANT
DRAPE SURG ORHT 6 SPLT 77X108 (DRAPES) ×4 IMPLANT
DRAPE U-SHAPE 47X51 STRL (DRAPES) ×3 IMPLANT
DRSG PAD ABDOMINAL 8X10 ST (GAUZE/BANDAGES/DRESSINGS) ×2 IMPLANT
DURAPREP 26ML APPLICATOR (WOUND CARE) ×3 IMPLANT
ELECT CAUTERY BLADE 6.4 (BLADE) IMPLANT
ELECT REM PT RETURN 9FT ADLT (ELECTROSURGICAL)
ELECTRODE REM PT RTRN 9FT ADLT (ELECTROSURGICAL) IMPLANT
GAUZE XEROFORM 1X8 LF (GAUZE/BANDAGES/DRESSINGS) ×3 IMPLANT
GAUZE XEROFORM 5X9 LF (GAUZE/BANDAGES/DRESSINGS) ×2 IMPLANT
GLOVE BIO SURGEON STRL SZ 6 (GLOVE) ×1 IMPLANT
GLOVE BIO SURGEON STRL SZ7.5 (GLOVE) ×1 IMPLANT
GLOVE BIO SURGEON STRL SZ8 (GLOVE) ×3 IMPLANT
GLOVE BIOGEL PI IND STRL 8 (GLOVE) ×4 IMPLANT
GLOVE BIOGEL PI INDICATOR 8 (GLOVE) ×2
GLOVE ORTHO TXT STRL SZ7.5 (GLOVE) ×4 IMPLANT
GOWN STRL REUS W/ TWL LRG LVL3 (GOWN DISPOSABLE) ×2 IMPLANT
GOWN STRL REUS W/ TWL XL LVL3 (GOWN DISPOSABLE) ×8 IMPLANT
GOWN STRL REUS W/TWL 2XL LVL3 (GOWN DISPOSABLE) ×1 IMPLANT
GOWN STRL REUS W/TWL LRG LVL3 (GOWN DISPOSABLE) ×6
GOWN STRL REUS W/TWL XL LVL3 (GOWN DISPOSABLE) ×12
HANDPIECE INTERPULSE COAX TIP (DISPOSABLE) ×3
IMMOBILIZER KNEE 20 (SOFTGOODS) ×3
IMMOBILIZER KNEE 20 THIGH 36 (SOFTGOODS) IMPLANT
KIT BASIN OR (CUSTOM PROCEDURE TRAY) ×3 IMPLANT
KIT ROOM TURNOVER OR (KITS) ×3 IMPLANT
MANIFOLD NEPTUNE II (INSTRUMENTS) ×3 IMPLANT
NDL 18GX1X1/2 (RX/OR ONLY) (NEEDLE) IMPLANT
NEEDLE 18GX1X1/2 (RX/OR ONLY) (NEEDLE) ×3 IMPLANT
NS IRRIG 1000ML POUR BTL (IV SOLUTION) ×3 IMPLANT
PACK ORTHO EXTREMITY (CUSTOM PROCEDURE TRAY) ×3 IMPLANT
PAD ABD 8X10 STRL (GAUZE/BANDAGES/DRESSINGS) ×2 IMPLANT
PADDING CAST ABS 4INX4YD NS (CAST SUPPLIES) ×2
PADDING CAST ABS COTTON 4X4 ST (CAST SUPPLIES) ×4 IMPLANT
PADDING CAST COTTON 6X4 STRL (CAST SUPPLIES) ×3 IMPLANT
SET HNDPC FAN SPRY TIP SCT (DISPOSABLE) IMPLANT
SPONGE GAUZE 4X4 12PLY (GAUZE/BANDAGES/DRESSINGS) ×4 IMPLANT
SPONGE LAP 18X18 X RAY DECT (DISPOSABLE) ×4 IMPLANT
STOCKINETTE IMPERVIOUS 9X36 MD (GAUZE/BANDAGES/DRESSINGS) ×3 IMPLANT
SUT CHROMIC 5 0 P 3 (SUTURE) ×1 IMPLANT
SUT ETHILON 2 0 FS 18 (SUTURE) ×11 IMPLANT
SUT ETHILON 3 0 PS 1 (SUTURE) ×6 IMPLANT
SUT PLAIN 5 0 P 3 18 (SUTURE) ×2 IMPLANT
SUT VIC AB 5-0 P-3 18XBRD (SUTURE) IMPLANT
SUT VIC AB 5-0 P3 18 (SUTURE) ×3
SYR 50ML LL SCALE MARK (SYRINGE) ×1 IMPLANT
SYR CONTROL 10ML LL (SYRINGE) IMPLANT
SYR TB 1ML LUER SLIP (SYRINGE) ×1 IMPLANT
TAPE CLOTH SURG 4X10 WHT LF (GAUZE/BANDAGES/DRESSINGS) ×2 IMPLANT
TOWEL OR 17X24 6PK STRL BLUE (TOWEL DISPOSABLE) ×3 IMPLANT
TOWEL OR 17X26 10 PK STRL BLUE (TOWEL DISPOSABLE) ×3 IMPLANT
TUBE ANAEROBIC SPECIMEN COL (MISCELLANEOUS) IMPLANT
TUBE CONNECTING 12X1/4 (SUCTIONS) ×3 IMPLANT
TUBE FEEDING 5FR 15 INCH (TUBING) IMPLANT
UNDERPAD 30X30 INCONTINENT (UNDERPADS AND DIAPERS) ×3 IMPLANT
WATER STERILE IRR 1000ML POUR (IV SOLUTION) ×2 IMPLANT
YANKAUER SUCT BULB TIP NO VENT (SUCTIONS) ×3 IMPLANT

## 2014-05-02 NOTE — Brief Op Note (Signed)
05/02/2014  9:05 PM  PATIENT:  Daniel Bonilla  58 y.o. male  PRE-OPERATIVE DIAGNOSIS:  Left tibia and left ankle deep lacerations; right arm deep laceration, lip laceration POST-OPERATIVE DIAGNOSIS:  same, lip laceration, ear laceration  PROCEDURE:  Procedure(s): IRRIGATION AND DEBRIDEMENT LEFT LEG, LEFT ANKLE  AND RIGHT ARM LACERATION AND CLOSURE OF  LACERATIONS TO RIGHT ARM AND LEFT LEG (Left) IRRIGATION AND DEBRIDEMENT LIP LACERATION AND CLOSURE. CLOSURE OF RIGHT EAR LACERATION (N/A)  SURGEON:  Surgeon(s) and Role: Panel 1:    * Kathryne Hitchhristopher Y Blackman, MD - Primary  Panel 2:    * Glenna FellowsBrinda Thimmappa, MD - Primary  PHYSICIAN ASSISTANT:   ASSISTANTS: none   ANESTHESIA:   general  EBL:  Total I/O In: 3000 [I.V.:3000] Out: -   BLOOD ADMINISTERED:none  DRAINS: none   LOCAL MEDICATIONS USED:  NONE  SPECIMEN:  No Specimen  DISPOSITION OF SPECIMEN:  N/A  COUNTS:  YES  TOURNIQUET:  * No tourniquets in log *  DICTATION: .Other Dictation: Dictation Number [id number]  PLAN OF CARE: Admit to inpatient   PATIENT DISPOSITION:  PACU - hemodynamically stable.   Delay start of Pharmacological VTE agent (>24hrs) due to surgical blood loss or risk of bleeding: no

## 2014-05-02 NOTE — Progress Notes (Signed)
Patient admitted to room 5n 17 from PACU. See flow sheet for initial assessment

## 2014-05-02 NOTE — ED Notes (Signed)
Portable x-ray called. OR ready [id number].
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
## 2014-05-02 NOTE — ED Notes (Signed)
Portable at bedside 

## 2014-05-02 NOTE — ED Notes (Addendum)
Family at beside. MD Blackmon talking with family about OR procedure.

## 2014-05-02 NOTE — Transfer of Care (Signed)
Immediate Anesthesia Transfer of Care Note  Patient: Daniel Bonilla  Procedure(s) Performed: Procedure(s): IRRIGATION AND DEBRIDEMENT LEFT LEG, LEFT ANKLE  AND RIGHT ARM LACERATION AND CLOSURE OF  LACERATIONS TO RIGHT ARM AND LEFT LEG (Left) IRRIGATION AND DEBRIDEMENT LIP LACERATION AND CLOSURE. CLOSURE OF RIGHT EAR LACERATION (N/A)  Patient Location: PACU  Anesthesia Type:General  Level of Consciousness: awake  Airway & Oxygen Therapy: Patient Spontanous Breathing and Patient connected to nasal cannula oxygen  Post-op Assessment: Report given to PACU RN, Post -op Vital signs reviewed and stable, Patient moving all extremities, Patient moving all extremities X 4 and Patient able to stick tongue midline  Post vital signs: Reviewed and stable  Complications: No apparent anesthesia complications
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
## 2014-05-02 NOTE — Anesthesia Preprocedure Evaluation (Signed)
Anesthesia Evaluation  Patient identified by MRN, date of birth, ID band Patient awake    Reviewed: Allergy & Precautions, H&P , NPO status , Patient's Chart, lab work & pertinent test results  Airway Mallampati: III TM Distance: >3 FB Neck ROM: Full    Dental no notable dental hx. (+) Teeth Intact, Dental Advisory Given   Pulmonary neg pulmonary ROS, former smoker,  breath sounds clear to auscultation  Pulmonary exam normal       Cardiovascular hypertension, On Medications + CAD and + Past MI Rhythm:Regular Rate:Normal     Neuro/Psych negative neurological ROS  negative psych ROS   GI/Hepatic negative GI ROS, Neg liver ROS,   Endo/Other  negative endocrine ROS  Renal/GU negative Renal ROS  negative genitourinary   Musculoskeletal   Abdominal   Peds  Hematology negative hematology ROS (+)   Anesthesia Other Findings   Reproductive/Obstetrics negative OB ROS                           Anesthesia Physical Anesthesia Plan  ASA: III  Anesthesia Plan: General   Post-op Pain Management:    Induction: Intravenous, Rapid sequence and Cricoid pressure planned  Airway Management Planned: Oral ETT  Additional Equipment:   Intra-op Plan:   Post-operative Plan: Extubation in OR  Informed Consent: I have reviewed the patients History and Physical, chart, labs and discussed the procedure including the risks, benefits and alternatives for the proposed anesthesia with the patient or authorized representative who has indicated his/her understanding and acceptance.   Dental advisory given  Plan Discussed with: CRNA  Anesthesia Plan Comments:         Anesthesia Quick Evaluation

## 2014-05-02 NOTE — ED Notes (Signed)
NOTIFIED DR. McLENNAN FOR TRAUMA PATIENTS LAB RESULTS OF CG4+ LACTIC ACID ,@18 :05 PM ,05/02/2014.

## 2014-05-02 NOTE — Progress Notes (Signed)
Patient ID: Daniel Bonilla, male   DOB: 1955-12-03, 58 y.o.   MRN: [id number] In the OR prior to prepping and draping I was able to exam his left knee and found it grossly unstable from a ligamentous standpoint.  A knee effusion is also noted.
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
## 2014-05-02 NOTE — Anesthesia Postprocedure Evaluation (Signed)
  Anesthesia Post-op Note  Patient: Daniel Bonilla Patient  Procedure(s) Performed: Procedure(s): IRRIGATION AND DEBRIDEMENT LEFT LEG, LEFT ANKLE  AND RIGHT ARM LACERATION AND CLOSURE OF  LACERATIONS TO RIGHT ARM AND LEFT LEG (Left) IRRIGATION AND DEBRIDEMENT LIP LACERATION AND CLOSURE. CLOSURE OF RIGHT EAR LACERATION (N/A)  Patient Location: PACU  Anesthesia Type:General  Level of Consciousness: awake and alert   Airway and Oxygen Therapy: Patient Spontanous Breathing  Post-op Pain: moderate  Post-op Assessment: Post-op Vital signs reviewed, Patient's Cardiovascular Status Stable and Respiratory Function Stable  Post-op Vital Signs: Reviewed  Filed Vitals:   05/02/14 2115  BP: 100/73  Pulse: 122  Temp:   Resp: 13    Complications: No apparent anesthesia complications
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
## 2014-05-02 NOTE — ED Notes (Signed)
Portable x-ray called. OR ready for patient.

## 2014-05-02 NOTE — Op Note (Signed)
Operative Note   DATE OF OPERATION: 6.28.15  [facility]  SURGICAL DIVISION: Plastic Surgery  PREOPERATIVE DIAGNOSES:  1. Laceration left cheek 2. Laceration Right preauricular 3. Laceration dorsum nose  POSTOPERATIVE DIAGNOSES:  same  PROCEDURE:  1. Complex repair left cheek, total 6 cm 2. Simple repair right cheek 1.5 cm 3 Simple repair nose 3 cm  SURGEON: Glenna FellowsBrinda Thimmappa MD MBA  ASSISTANT: none  ANESTHESIA:  General.   EBL: minimal  COMPLICATIONS: None.   INDICATIONS FOR PROCEDURE:  The patient, Daniel Bonilla, is a 58 y.o. male born on [birth year], is here for repair facial lacerations.   FINDINGS: Intact mental nerve identified within laceration over left side.   DESCRIPTION OF PROCEDURE:  The patient was taken to the operating room. IV antibiotics were given. The patient's operative site was prepped and draped in a sterile fashion. A time out was performed and all information was confirmed to be correct. Sharp excision of wound margins over left cheek and right preauricular lacerations completed. Right preauricular laceration represented avulsion of skin only. Left cheek laceration tracked down to mandible, through mentalis muscle laterally. Periosteum and soft tissue over bone intact, mental nerve intact. Wounds irrigated. Nasal dorsum closed with simple running 5-0 plain gut. Left cheek repaired in layers with 5-0 vicryl in muscle, 5-0 vicryl in dermis and running 5-0 chromic for skin closure. Avulsed flap right preauricular approximated with interrupted 5-0 plain gut.   The patient was allowed to wake from anesthesia, extubated and taken to the recovery room in satisfactory condition.   SPECIMENS: none  DRAINS: none  Glenna FellowsBrinda Thimmappa, MD [facility] [phone]

## 2014-05-02 NOTE — Anesthesia Procedure Notes (Signed)
Procedure Name: Intubation Date/Time: 05/02/2014 8:17 PM Performed by: Luster LandsbergHASE, TONJA R Pre-anesthesia Checklist: Patient identified, Emergency Drugs available, Suction available and Patient being monitored Patient Re-evaluated:Patient Re-evaluated prior to inductionOxygen Delivery Method: Circle system utilized Preoxygenation: Pre-oxygenation with 100% oxygen Intubation Type: IV induction, Rapid sequence and Cricoid Pressure applied Laryngoscope Size: Mac and 3 Grade View: Grade I Tube type: Oral Tube size: 8.0 mm Number of attempts: 2 Airway Equipment and Method: Stylet Placement Confirmation: ETT inserted through vocal cords under direct vision,  positive ETCO2 and breath sounds checked- equal and bilateral Secured at: 23 cm Tube secured with: Tape Dental Injury: Teeth and Oropharynx as per pre-operative assessment  Comments: DVL x1 with esophageal intubation - ETT removed and with second DVL + tracheal intubation.  Cricoid pressure held throughout.   New ETT placed for second attempt.

## 2014-05-03 LAB — CBC
HCT: 31.6 % — ABNORMAL LOW (ref 39.0–52.0)
Hemoglobin: 10.5 g/dL — ABNORMAL LOW (ref 13.0–17.0)
MCH: 29.4 pg (ref 26.0–34.0)
MCHC: 33.2 g/dL (ref 30.0–36.0)
MCV: 88.5 fL (ref 78.0–100.0)
PLATELETS: 204 10*3/uL (ref 150–400)
RBC: 3.57 MIL/uL — AB (ref 4.22–5.81)
RDW: 13.6 % (ref 11.5–15.5)
WBC: 11 10*3/uL — ABNORMAL HIGH (ref 4.0–10.5)

## 2014-05-03 LAB — COMPREHENSIVE METABOLIC PANEL
ALT: 47 U/L (ref 0–53)
AST: 112 U/L — AB (ref 0–37)
Albumin: 2.8 g/dL — ABNORMAL LOW (ref 3.5–5.2)
Alkaline Phosphatase: 78 U/L (ref 39–117)
BILIRUBIN TOTAL: 0.7 mg/dL (ref 0.3–1.2)
BUN: 16 mg/dL (ref 6–23)
CHLORIDE: 107 meq/L (ref 96–112)
CO2: 20 meq/L (ref 19–32)
Calcium: 7.6 mg/dL — ABNORMAL LOW (ref 8.4–10.5)
Creatinine, Ser: 0.98 mg/dL (ref 0.50–1.35)
GFR calc Af Amer: >90 mL/min (ref >90)
GFR calc non Af Amer: 89 mL/min — ABNORMAL LOW (ref >90)
Glucose, Bld: 131 mg/dL — ABNORMAL HIGH (ref 70–99)
Potassium: 4.3 mEq/L (ref 3.7–5.3)
SODIUM: 141 meq/L (ref 137–147)
Total Protein: 5.8 g/dL — ABNORMAL LOW (ref 6.0–8.3)

## 2014-05-03 NOTE — Progress Notes (Signed)
Patient states he would like anti-emetic, Zofran last given at 2345 and not able to give again until 0545, as explained to patient

## 2014-05-03 NOTE — Evaluation (Signed)
Physical Therapy Evaluation Patient Details Name: Daniel FewRobin Pousson MRN: [id number] DOB: 12-17-1955 Today's Date: 05/03/2014   History of Present Illness  [facility] to L LE and R UE both requiring I+D.    Clinical Impression  Pt very motivated to improve mobility.  Feel pt will make great progress to D/C to home with family support.  Will need to increase amb distance and perform stairs prior to D/C.  Will continue to follow.      Follow Up Recommendations Home health PT;Supervision - Intermittent    Equipment Recommendations  Rolling walker with 5" wheels    Recommendations for Other Services       Precautions / Restrictions Precautions Precautions: Fall Required Braces or Orthoses: Knee Immobilizer - Left Knee Immobilizer - Left: On at all times Restrictions Weight Bearing Restrictions: Yes LLE Weight Bearing: Non weight bearing      Mobility  Bed Mobility Overal bed mobility: Needs Assistance Bed Mobility: Supine to Sit     Supine to sit: Mod assist     General bed mobility comments: A with L LE and trunk to come to sitting.    Transfers Overall transfer level: Needs assistance Equipment used: Rolling walker (2 wheeled) Transfers: Sit to/from Stand Sit to Stand: Min assist         General transfer comment: cues for UE use, positioning of L LE and controlling descent to sitting.    Ambulation/Gait Ambulation/Gait assistance: Min assist Ambulation Distance (Feet): 20 Feet (and 30) Assistive device: Rolling walker (2 wheeled) Gait Pattern/deviations: Step-to pattern     General Gait Details: pt does well maintaining NWBing on L LE.  cues to stay closer to RW.    Stairs            Wheelchair Mobility    Modified Rankin (Stroke Patients Only)       Balance Overall balance assessment: Needs assistance Sitting-balance support: No upper extremity supported;Feet supported Sitting balance-Leahy Scale: Fair      Standing balance support: Single extremity supported Standing balance-Leahy Scale: Poor                               Pertinent Vitals/Pain L LE and shoulder 4-6/10 during mobility.  Premedicated.      Home Living Family/patient expects to be discharged to:: Private residence Living Arrangements: Spouse/significant other Available Help at Discharge: Family;Available 24 hours/day Type of Home: House Home Access: Stairs to enter Entrance Stairs-Rails: Right Entrance Stairs-Number of Steps: 5 Home Layout: One level Home Equipment:  (Might have a 3-in-1.  pt family to check.  )      Prior Function Level of Independence: Independent               Hand Dominance        Extremity/Trunk Assessment   Upper Extremity Assessment: RUE deficits/detail;LUE deficits/detail RUE Deficits / Details: Elbow ROM limited by UE I+D, but demos good functional mobility.       LUE Deficits / Details: pt indicates L shoulder pain.  Hematoma noted along with decreased shoulder flexion and abduction.     Lower Extremity Assessment: LLE deficits/detail   LLE Deficits / Details: LE in KI at all times.  pt able to A with lifting LE and able to wiggle toes.    Cervical / Trunk Assessment: Normal  Communication   Communication: No difficulties  Cognition Arousal/Alertness: Awake/alert Behavior During Therapy: WFL for tasks  assessed/performed Overall Cognitive Status: Within Functional Limits for tasks assessed                      General Comments      Exercises        Assessment/Plan    PT Assessment Patient needs continued PT services  PT Diagnosis Difficulty walking;Acute pain   PT Problem List Decreased strength;Decreased activity tolerance;Decreased balance;Decreased mobility;Decreased knowledge of use of DME;Pain  PT Treatment Interventions DME instruction;Gait training;Stair training;Functional mobility training;Therapeutic activities;Therapeutic  exercise;Balance training;Patient/family education   PT Goals (Current goals can be found in the Care Plan section) Acute Rehab PT Goals Patient Stated Goal: Back to work PT Goal Formulation: With patient Time For Goal Achievement: 05/10/14 Potential to Achieve Goals: Good    Frequency Min 5X/week   Barriers to discharge        Co-evaluation               End of Session Equipment Utilized During Treatment: Gait belt;Left knee immobilizer Activity Tolerance: Patient tolerated treatment well Patient left: in chair;with call bell/phone within reach Nurse Communication: Mobility status         Time: [id number] PT Time Calculation (min): 36 min   Charges:   PT Evaluation $Initial PT Evaluation Tier I: 1 Procedure PT Treatments $Gait Training: 23-37 mins   PT G CodesSunny Schlein:          Ritenour, Megan F, [place] [phone] 05/03/2014, 2:13 PM
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
## 2014-05-03 NOTE — Progress Notes (Signed)
UR completed.  Michelle Bryson, RN BSN M[facility] CCM Trauma/Neuro ICU Case Manager [phone]  

## 2014-05-03 NOTE — Progress Notes (Signed)
The patient is doing well with PT.  Should be able to go home in the near future.  This patient has been seen and I agree with the findings and treatment plan.  Marta LamasJames O. Gae BonWyatt, III, MD, FACS [phone] (pager) [phone] (direct pager) Trauma Surgeon
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
## 2014-05-03 NOTE — Progress Notes (Signed)
Subjective: 1 Day Post-Op Procedure(s) (LRB): IRRIGATION AND DEBRIDEMENT LEFT LEG, LEFT ANKLE  AND RIGHT ARM LACERATION AND CLOSURE OF  LACERATIONS TO RIGHT ARM AND LEFT LEG (Left) IRRIGATION AND DEBRIDEMENT LIP LACERATION AND CLOSURE. CLOSURE OF RIGHT EAR LACERATION (N/A) Patient reports pain as moderate.    Objective: Vital signs in last 24 hours: Temp:  [97.9 F (36.6 C)-98.7 F (37.1 C)] 98.7 F (37.1 C) (06/29 0547) Pulse Rate:  [40-138] 109 (06/29 0547) Resp:  [10-29] 20 (06/29 0547) BP: (68-160)/(34-100) 109/74 mmHg (06/29 0547) SpO2:  [76 %-100 %] 94 % (06/29 0547) Weight:  [83.915 kg (185 lb)-91.173 kg (201 lb)] 91.173 kg (201 lb) (06/28 2256)  Intake/Output from previous day: 06/28 0701 - 06/29 0700 In: 5255 [P.O.:1080; I.V.:4175] Out: 1900 [Urine:1900] Intake/Output this shift:     Recent Labs  05/02/14 1800 05/03/14 0430  HGB 14.1 10.5*    Recent Labs  05/02/14 1800 05/03/14 0430  WBC 17.3* 11.0*  RBC 4.84 3.57*  HCT 41.7 31.6*  PLT 236 204    Recent Labs  05/02/14 1800 05/03/14 0430  NA 140 141  K 3.6* 4.3  CL 103 107  CO2 18* 20  BUN 13 16  CREATININE 1.04 0.98  GLUCOSE 102* 131*  CALCIUM 8.6 7.6*    Recent Labs  05/02/14 1800  INR 1.06   Left leg: Sensation intact distally Dorsiflexion/Plantar flexion intact Incision: dressing C/D/I Compartment soft His left foot appears well-perfused  Assessment/Plan: 1 Day Post-Op Procedure(s) (LRB): IRRIGATION AND DEBRIDEMENT LEFT LEG, LEFT ANKLE  AND RIGHT ARM LACERATION AND CLOSURE OF  LACERATIONS TO RIGHT ARM AND LEFT LEG (Left) IRRIGATION AND DEBRIDEMENT LIP LACERATION AND CLOSURE. CLOSURE OF RIGHT EAR LACERATION (N/A) Up with therapy, NWB left leg. Needs MRI of his left knee to rule-out ligamentous injury  BLACKMAN,CHRISTOPHER Y 05/03/2014, 7:16 AM

## 2014-05-04 ENCOUNTER — Encounter (HOSPITAL_COMMUNITY): Payer: Self-pay | Admitting: *Deleted

## 2014-05-04 ENCOUNTER — Inpatient Hospital Stay (HOSPITAL_COMMUNITY): Payer: BC Managed Care – PPO

## 2014-05-04 DIAGNOSIS — S83419A Sprain of medial collateral ligament of unspecified knee, initial encounter: Secondary | ICD-10-CM

## 2014-05-04 DIAGNOSIS — S83519A Sprain of anterior cruciate ligament of unspecified knee, initial encounter: Secondary | ICD-10-CM

## 2014-05-04 DIAGNOSIS — S83207A Unspecified tear of unspecified meniscus, current injury, left knee, initial encounter: Secondary | ICD-10-CM

## 2014-05-04 HISTORY — DX: Sprain of anterior cruciate ligament of unspecified knee, initial encounter: S83.519A

## 2014-05-04 HISTORY — DX: Sprain of medial collateral ligament of unspecified knee, initial encounter: S83.419A

## 2014-05-04 HISTORY — DX: Unspecified tear of unspecified meniscus, current injury, left knee, initial encounter: S83.207A

## 2014-05-04 LAB — CBC
HCT: 28.4 % — ABNORMAL LOW (ref 39.0–52.0)
HEMATOCRIT: 26.4 % — AB (ref 39.0–52.0)
Hemoglobin: 9.5 g/dL — ABNORMAL LOW (ref 13.0–17.0)
Hemoglobin: 8.9 g/dL — ABNORMAL LOW (ref 13.0–17.0)
MCH: 29.6 pg (ref 26.0–34.0)
MCH: 29.7 pg (ref 26.0–34.0)
MCHC: 33.5 g/dL (ref 30.0–36.0)
MCHC: 33.7 g/dL (ref 30.0–36.0)
MCV: 88.5 fL (ref 78.0–100.0)
MCV: 88 fL (ref 78.0–100.0)
PLATELETS: 178 10*3/uL (ref 150–400)
PLATELETS: 184 10*3/uL (ref 150–400)
RBC: 3.21 MIL/uL — ABNORMAL LOW (ref 4.22–5.81)
RBC: 3 MIL/uL — ABNORMAL LOW (ref 4.22–5.81)
RDW: 13.3 % (ref 11.5–15.5)
RDW: 13.3 % (ref 11.5–15.5)
WBC: 10.7 10*3/uL — ABNORMAL HIGH (ref 4.0–10.5)
WBC: 11.2 10*3/uL — ABNORMAL HIGH (ref 4.0–10.5)

## 2014-05-04 LAB — TROPONIN I
Troponin I: <0.3 ng/mL (ref <0.30)
Troponin I: <0.3 ng/mL (ref <0.30)

## 2014-05-04 MED ORDER — DSS 100 MG PO CAPS: 100.0000 mg | ORAL_CAPSULE | Freq: Two times a day (BID) | ORAL | Status: DC | PRN

## 2014-05-04 MED ORDER — ASPIRIN 325 MG PO TABS: 325.0000 mg | ORAL_TABLET | Freq: Two times a day (BID) | ORAL | Status: DC

## 2014-05-04 MED ORDER — OXYCODONE HCL 5 MG PO TABS: 5.0000 mg | ORAL_TABLET | Freq: Four times a day (QID) | ORAL | Status: DC | PRN

## 2014-05-04 MED ORDER — BISACODYL 10 MG RE SUPP
10.0000 mg | Freq: Once | RECTAL | Status: AC
  Administered 2014-05-04: 10 mg via RECTAL
  Filled 2014-05-04: qty 1

## 2014-05-04 MED ORDER — METHOCARBAMOL 500 MG PO TABS: 500.0000 mg | ORAL_TABLET | Freq: Three times a day (TID) | ORAL | Status: DC | PRN

## 2014-05-04 MED ORDER — ASPIRIN 325 MG PO TABS
325.0000 mg | ORAL_TABLET | Freq: Once | ORAL | Status: AC
  Administered 2014-05-04: 325 mg via ORAL
  Filled 2014-05-04: qty 1

## 2014-05-04 NOTE — Progress Notes (Signed)
Physical Therapy Treatment Patient Details Name: Daniel FewRobin Mao MRN: [id number] DOB: 1956-06-22 Today's Date: 05/04/2014    History of Present Illness [facility] L LE and R UE both requiring I+D.      PT Comments    Pt declined attempting stairs today stating he plans to go up on his butt like he did last time he broke his leg.  Family present and confirmed pt was able to do this with last LE fracture.  Pt states he is ready for D/C and feel he has good family support to D/C today from PT stand point.  Pt states only DME needed is RW.  Will continue to follow if remains on acute.    Follow Up Recommendations  Home health PT;Supervision - Intermittent     Equipment Recommendations  Rolling walker with 5" wheels    Recommendations for Other Services       Precautions / Restrictions Precautions Precautions: Fall Required Braces or Orthoses: Knee Immobilizer - Left Knee Immobilizer - Left: On at all times Restrictions Weight Bearing Restrictions: Yes LLE Weight Bearing: Non weight bearing    Mobility  Bed Mobility Overal bed mobility: Needs Assistance Bed Mobility: Supine to Sit     Supine to sit: Min assist     General bed mobility comments: A with L LE only.  pt able to bring trunk up to sitting without use of bed rails.    Transfers Overall transfer level: Needs assistance Equipment used: Rolling walker (2 wheeled) Transfers: Sit to/from Stand Sit to Stand: Min guard         General transfer comment: Demos good technique.    Ambulation/Gait Ambulation/Gait assistance: Min guard Ambulation Distance (Feet): 30 Feet (x2) Assistive device: Rolling walker (2 wheeled) Gait Pattern/deviations: Step-to pattern     General Gait Details: pt continues to do well with NWBing on L LE.  cues for upright posture and staying closer to RW.     Stairs Stairs:  (pt declined)          Wheelchair Mobility    Modified Rankin  (Stroke Patients Only)       Balance Overall balance assessment: Needs assistance Sitting-balance support: No upper extremity supported;Feet supported Sitting balance-Leahy Scale: Fair     Standing balance support: Single extremity supported Standing balance-Leahy Scale: Poor                      Cognition Arousal/Alertness: Awake/alert Behavior During Therapy: WFL for tasks assessed/performed Overall Cognitive Status: Within Functional Limits for tasks assessed                      Exercises      General Comments        Pertinent Vitals/Pain 5/10 during activity.  Premedicated.      Home Living                      Prior Function            PT Goals (current goals can now be found in the care plan section) Acute Rehab PT Goals Patient Stated Goal: Back to work Time For Goal Achievement: 05/10/14 Potential to Achieve Goals: Good Progress towards PT goals: Progressing toward goals    Frequency  Min 5X/week    PT Plan Current plan remains appropriate    Co-evaluation             End of  Session Equipment Utilized During Treatment: Gait belt;Left knee immobilizer Activity Tolerance: Patient tolerated treatment well Patient left: in chair;with call bell/phone within reach;with family/visitor present     Time: 1000-1019 PT Time Calculation (min): 19 min  Charges:  $Gait Training: 8-22 mins                    G CodesSunny Schlein:      Ritenour, Megan F, [place]PT [phone] 05/04/2014, 11:34 AM
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
## 2014-05-04 NOTE — Progress Notes (Signed)
Subjective: 2 Days Post-Op Procedure(s) (LRB): IRRIGATION AND DEBRIDEMENT LEFT LEG, LEFT ANKLE  AND RIGHT ARM LACERATION AND CLOSURE OF  LACERATIONS TO RIGHT ARM AND LEFT LEG (Left) IRRIGATION AND DEBRIDEMENT LIP LACERATION AND CLOSURE. CLOSURE OF RIGHT EAR LACERATION (N/A) Patient reports pain as moderate.  MRI shows high grade ligament partial tears (ACL/MCL) and a medial meniscal tear.  Objective: Vital signs in last 24 hours: Temp:  [97.9 F (36.6 C)-100.7 F (38.2 C)] 100.7 F (38.2 C) (06/30 0550) Pulse Rate:  [109-124] 122 (06/30 0550) Resp:  [16-20] 16 (06/30 0550) BP: (112-131)/(56-86) 114/86 mmHg (06/30 0550) SpO2:  [91 %-98 %] 93 % (06/30 0550)  Intake/Output from previous day: 06/29 0701 - 06/30 0700 In: 1179.5 [P.O.:1179.5] Out: 250 [Urine:250] Intake/Output this shift: Total I/O In: 240 [P.O.:240] Out: -    Recent Labs  05/02/14 1800 05/03/14 0430 05/04/14 0500  HGB 14.1 10.5* 9.5*    Recent Labs  05/03/14 0430 05/04/14 0500  WBC 11.0* 10.7*  RBC 3.57* 3.21*  HCT 31.6* 28.4*  PLT 204 178    Recent Labs  05/02/14 1800 05/03/14 0430  NA 140 141  K 3.6* 4.3  CL 103 107  CO2 18* 20  BUN 13 16  CREATININE 1.04 0.98  GLUCOSE 102* 131*  CALCIUM 8.6 7.6*    Recent Labs  05/02/14 1800  INR 1.06    Sensation intact distally Intact pulses distally Dorsiflexion/Plantar flexion intact Incision: no drainage No cellulitis present Compartment soft  Assessment/Plan: 2 Days Post-Op Procedure(s) (LRB): IRRIGATION AND DEBRIDEMENT LEFT LEG, LEFT ANKLE  AND RIGHT ARM LACERATION AND CLOSURE OF  LACERATIONS TO RIGHT ARM AND LEFT LEG (Left) IRRIGATION AND DEBRIDEMENT LIP LACERATION AND CLOSURE. CLOSURE OF RIGHT EAR LACERATION (N/A) Up with therapy Can discharge to home from Ortho standpoint with follow-up with me in one week He will remain NWB on his left leg until further notice.  Kathryne HitchBLACKMAN,CHRISTOPHER Y 05/04/2014, 12:33 PM

## 2014-05-04 NOTE — Discharge Instructions (Signed)
Take aspirin 325mg  twice a day until Dr. Magnus IvanBlackman tells you otherwise.  You can hold off on resuming your Plavix until you talk with your cardiologist Dr. SwazilandJordan.  ------------------------------------------------------  No weight on your left leg at all.  You should ice your left knee and ankle through out the day 20 minutes on/20 minutes off. You can start getting all of your incisions wet daily in the shower starting 05/07/14; then new dry dressing daily. You can come out of your boot and knee immobilizer occasionally to rest or get air. No knee bending until further notice.

## 2014-05-04 NOTE — Progress Notes (Signed)
Orthopedic Tech Progress Note Patient Details:  Daniel Bonilla May 22, 1956 [id number]  Ortho Devices Type of Ortho Device: CAM walker Ortho Device/Splint Location: cam walker applied to the left lower extremity. wear and care is gone verbally. patient will conctact the nursing staff with any further questions.   Early CharsBaker,[name] 05/04/2014, 2:16 PM
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
## 2014-05-04 NOTE — Progress Notes (Signed)
Patient discharged to home accompanied by family. Discharge instructions and rx given and explained and patient stated understanding. IVs were removed and all dressings were changed. Patient left unit in a stable condition with all personal belongings via wheelchair.

## 2014-05-05 NOTE — Progress Notes (Signed)
Patient much improved.  [facility]  This patient has been seen and I agree with the findings and treatment plan.  Marta LamasJames O. Gae BonWyatt, III, MD, FACS [phone] (pager) [phone] (direct pager) Trauma Surgeon

## 2014-05-05 NOTE — ED Provider Notes (Signed)
I saw and evaluated the patient, reviewed the resident's note and I agree with the findings and plan.   EKG Interpretation None        Rolan BuccoMelanie Belfi, MD 05/05/14 2132

## 2014-05-06 ENCOUNTER — Other Ambulatory Visit: Payer: Self-pay | Admitting: *Deleted

## 2014-05-06 MED ORDER — ROSUVASTATIN CALCIUM 40 MG PO TABS: 40.0000 mg | ORAL_TABLET | Freq: Every day | ORAL | Status: DC

## 2014-05-06 NOTE — Telephone Encounter (Signed)
Patients wife called for crestor refill for patient. She is aware the he must keep 05/10/14 appointment for further refills.

## 2014-05-10 ENCOUNTER — Encounter: Payer: Self-pay | Admitting: Cardiology

## 2014-05-10 ENCOUNTER — Ambulatory Visit (INDEPENDENT_AMBULATORY_CARE_PROVIDER_SITE_OTHER): Payer: BC Managed Care – PPO | Admitting: Cardiology

## 2014-05-10 VITALS — BP 110/60 | HR 108 | Ht 69.0 in | Wt 189.0 lb

## 2014-05-10 DIAGNOSIS — E785 Hyperlipidemia, unspecified: Secondary | ICD-10-CM

## 2014-05-10 DIAGNOSIS — I2119 ST elevation (STEMI) myocardial infarction involving other coronary artery of inferior wall: Secondary | ICD-10-CM

## 2014-05-10 DIAGNOSIS — I10 Essential (primary) hypertension: Secondary | ICD-10-CM

## 2014-05-10 DIAGNOSIS — I2510 Atherosclerotic heart disease of native coronary artery without angina pectoris: Secondary | ICD-10-CM

## 2014-05-10 MED ORDER — METOPROLOL TARTRATE 25 MG PO TABS: 25.0000 mg | ORAL_TABLET | Freq: Two times a day (BID) | ORAL | Status: DC

## 2014-05-10 MED ORDER — NITROGLYCERIN 0.4 MG SL SUBL: 0.4000 mg | SUBLINGUAL_TABLET | SUBLINGUAL | Status: AC | PRN

## 2014-05-10 NOTE — Patient Instructions (Signed)
Resume metoprolol 25 mg twice a day.  Discuss with Dr. Azucena CecilSwayne about your dose of Crestor.  You can stay off Plavix.  I will see you in one year.
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
## 2014-07-07 ENCOUNTER — Ambulatory Visit: Payer: BC Managed Care – PPO | Admitting: Cardiology

## 2014-07-09 ENCOUNTER — Encounter (HOSPITAL_BASED_OUTPATIENT_CLINIC_OR_DEPARTMENT_OTHER): Payer: No Typology Code available for payment source | Attending: General Surgery

## 2014-08-20 ENCOUNTER — Other Ambulatory Visit: Payer: Self-pay

## 2015-04-21 ENCOUNTER — Other Ambulatory Visit: Payer: Self-pay | Admitting: Family Medicine

## 2015-04-21 DIAGNOSIS — K869 Disease of pancreas, unspecified: Secondary | ICD-10-CM

## 2015-04-21 DIAGNOSIS — H61101 Unspecified noninfective disorders of pinna, right ear: Secondary | ICD-10-CM

## 2015-04-21 DIAGNOSIS — R911 Solitary pulmonary nodule: Secondary | ICD-10-CM

## 2015-11-04 ENCOUNTER — Other Ambulatory Visit: Payer: Self-pay | Admitting: Family Medicine

## 2015-11-04 DIAGNOSIS — K869 Disease of pancreas, unspecified: Secondary | ICD-10-CM

## 2015-11-30 ENCOUNTER — Other Ambulatory Visit: Payer: Self-pay | Admitting: Family Medicine

## 2015-11-30 DIAGNOSIS — K869 Disease of pancreas, unspecified: Secondary | ICD-10-CM

## 2015-12-07 ENCOUNTER — Other Ambulatory Visit: Payer: Self-pay

## 2015-12-12 ENCOUNTER — Ambulatory Visit
Admission: RE | Admit: 2015-12-12 | Discharge: 2015-12-12 | Disposition: A | Discharge status: Home / Self Care | Payer: BLUE CROSS/BLUE SHIELD | Source: Ambulatory Visit | Attending: Family Medicine | Admitting: Family Medicine

## 2015-12-12 DIAGNOSIS — K869 Disease of pancreas, unspecified: Secondary | ICD-10-CM

## 2015-12-21 ENCOUNTER — Other Ambulatory Visit: Payer: Self-pay | Admitting: Family Medicine

## 2015-12-21 DIAGNOSIS — K8689 Other specified diseases of pancreas: Secondary | ICD-10-CM

## 2015-12-30 ENCOUNTER — Other Ambulatory Visit: Payer: Self-pay | Admitting: Family Medicine

## 2015-12-30 ENCOUNTER — Ambulatory Visit
Admission: RE | Admit: 2015-12-30 | Discharge: 2015-12-30 | Disposition: A | Discharge status: Home / Self Care | Payer: BLUE CROSS/BLUE SHIELD | Source: Ambulatory Visit | Attending: Family Medicine | Admitting: Family Medicine

## 2015-12-30 DIAGNOSIS — Z77018 Contact with and (suspected) exposure to other hazardous metals: Secondary | ICD-10-CM

## 2015-12-30 DIAGNOSIS — K8689 Other specified diseases of pancreas: Secondary | ICD-10-CM

## 2015-12-30 MED ORDER — GADOBENATE DIMEGLUMINE 529 MG/ML IV SOLN
17.0000 mL | Freq: Once | INTRAVENOUS | Status: AC | PRN
  Administered 2015-12-30: 17 mL via INTRAVENOUS

## 2019-11-24 ENCOUNTER — Inpatient Hospital Stay (HOSPITAL_COMMUNITY)
Admission: EM | Admit: 2019-11-24 | Discharge: 2019-11-27 | DRG: 176 | Disposition: A | Discharge status: Home / Self Care | Payer: BC Managed Care – PPO | Attending: Internal Medicine | Admitting: Internal Medicine

## 2019-11-24 ENCOUNTER — Ambulatory Visit (INDEPENDENT_AMBULATORY_CARE_PROVIDER_SITE_OTHER): Payer: BC Managed Care – PPO

## 2019-11-24 ENCOUNTER — Emergency Department (HOSPITAL_COMMUNITY): Payer: BC Managed Care – PPO

## 2019-11-24 ENCOUNTER — Other Ambulatory Visit: Payer: Self-pay

## 2019-11-24 ENCOUNTER — Ambulatory Visit (INDEPENDENT_AMBULATORY_CARE_PROVIDER_SITE_OTHER)
Admission: EM | Admit: 2019-11-24 | Discharge: 2019-11-24 | Disposition: A | Discharge status: Home / Self Care | Payer: BC Managed Care – PPO | Source: Home / Self Care

## 2019-11-24 DIAGNOSIS — Z85828 Personal history of other malignant neoplasm of skin: Secondary | ICD-10-CM

## 2019-11-24 DIAGNOSIS — Z7982 Long term (current) use of aspirin: Secondary | ICD-10-CM | POA: Diagnosis not present

## 2019-11-24 DIAGNOSIS — Z79899 Other long term (current) drug therapy: Secondary | ICD-10-CM

## 2019-11-24 DIAGNOSIS — E785 Hyperlipidemia, unspecified: Secondary | ICD-10-CM | POA: Diagnosis present

## 2019-11-24 DIAGNOSIS — J9811 Atelectasis: Secondary | ICD-10-CM

## 2019-11-24 DIAGNOSIS — R0902 Hypoxemia: Secondary | ICD-10-CM | POA: Diagnosis present

## 2019-11-24 DIAGNOSIS — K869 Disease of pancreas, unspecified: Secondary | ICD-10-CM | POA: Diagnosis not present

## 2019-11-24 DIAGNOSIS — Z87891 Personal history of nicotine dependence: Secondary | ICD-10-CM

## 2019-11-24 DIAGNOSIS — I82462 Acute embolism and thrombosis of left calf muscular vein: Secondary | ICD-10-CM | POA: Diagnosis not present

## 2019-11-24 DIAGNOSIS — I82442 Acute embolism and thrombosis of left tibial vein: Secondary | ICD-10-CM | POA: Diagnosis present

## 2019-11-24 DIAGNOSIS — I2510 Atherosclerotic heart disease of native coronary artery without angina pectoris: Secondary | ICD-10-CM | POA: Diagnosis present

## 2019-11-24 DIAGNOSIS — I82402 Acute embolism and thrombosis of unspecified deep veins of left lower extremity: Secondary | ICD-10-CM | POA: Diagnosis not present

## 2019-11-24 DIAGNOSIS — R000 Tachycardia, unspecified: Secondary | ICD-10-CM | POA: Diagnosis present

## 2019-11-24 DIAGNOSIS — R079 Chest pain, unspecified: Secondary | ICD-10-CM | POA: Diagnosis not present

## 2019-11-24 DIAGNOSIS — I2699 Other pulmonary embolism without acute cor pulmonale: Secondary | ICD-10-CM

## 2019-11-24 DIAGNOSIS — R0782 Intercostal pain: Secondary | ICD-10-CM | POA: Diagnosis not present

## 2019-11-24 DIAGNOSIS — Z20822 Contact with and (suspected) exposure to covid-19: Secondary | ICD-10-CM | POA: Diagnosis present

## 2019-11-24 DIAGNOSIS — R05 Cough: Secondary | ICD-10-CM

## 2019-11-24 DIAGNOSIS — E213 Hyperparathyroidism, unspecified: Secondary | ICD-10-CM | POA: Diagnosis present

## 2019-11-24 DIAGNOSIS — K862 Cyst of pancreas: Secondary | ICD-10-CM | POA: Diagnosis present

## 2019-11-24 DIAGNOSIS — K8689 Other specified diseases of pancreas: Secondary | ICD-10-CM

## 2019-11-24 DIAGNOSIS — I10 Essential (primary) hypertension: Secondary | ICD-10-CM | POA: Diagnosis present

## 2019-11-24 DIAGNOSIS — Z955 Presence of coronary angioplasty implant and graft: Secondary | ICD-10-CM

## 2019-11-24 DIAGNOSIS — I252 Old myocardial infarction: Secondary | ICD-10-CM

## 2019-11-24 DIAGNOSIS — R0602 Shortness of breath: Secondary | ICD-10-CM | POA: Diagnosis not present

## 2019-11-24 DIAGNOSIS — D72829 Elevated white blood cell count, unspecified: Secondary | ICD-10-CM | POA: Diagnosis not present

## 2019-11-24 DIAGNOSIS — I2694 Multiple subsegmental pulmonary emboli without acute cor pulmonale: Principal | ICD-10-CM | POA: Diagnosis present

## 2019-11-24 HISTORY — DX: Acute myocardial infarction, unspecified: I21.9

## 2019-11-24 HISTORY — DX: Other pulmonary embolism without acute cor pulmonale: I26.99

## 2019-11-24 LAB — CBC WITH DIFFERENTIAL/PLATELET
Abs Immature Granulocytes: 0.13 10*3/uL — ABNORMAL HIGH (ref 0.00–0.07)
Basophils Absolute: 0.1 10*3/uL (ref 0.0–0.1)
Basophils Relative: 0 %
Eosinophils Absolute: 0 10*3/uL (ref 0.0–0.5)
Eosinophils Relative: 0 %
HCT: 47.8 % (ref 39.0–52.0)
Hemoglobin: 15.8 g/dL (ref 13.0–17.0)
Immature Granulocytes: 1 %
Lymphocytes Relative: 6 %
Lymphs Abs: 1 10*3/uL (ref 0.7–4.0)
MCH: 29.1 pg (ref 26.0–34.0)
MCHC: 33.1 g/dL (ref 30.0–36.0)
MCV: 88 fL (ref 80.0–100.0)
Monocytes Absolute: 2 10*3/uL — ABNORMAL HIGH (ref 0.1–1.0)
Monocytes Relative: 12 %
Neutro Abs: 13.3 10*3/uL — ABNORMAL HIGH (ref 1.7–7.7)
Neutrophils Relative %: 81 %
Platelets: 281 10*3/uL (ref 150–400)
RBC: 5.43 MIL/uL (ref 4.22–5.81)
RDW: 12.6 % (ref 11.5–15.5)
WBC: 16.5 10*3/uL — ABNORMAL HIGH (ref 4.0–10.5)
nRBC: 0 % (ref 0.0–0.2)

## 2019-11-24 LAB — TROPONIN I (HIGH SENSITIVITY)
Troponin I (High Sensitivity): 10 ng/L (ref <18)
Troponin I (High Sensitivity): 11 ng/L (ref <18)

## 2019-11-24 LAB — BASIC METABOLIC PANEL
Anion gap: 12 (ref 5–15)
BUN: 14 mg/dL (ref 8–23)
CO2: 21 mmol/L — ABNORMAL LOW (ref 22–32)
Calcium: 12.2 mg/dL — ABNORMAL HIGH (ref 8.9–10.3)
Chloride: 103 mmol/L (ref 98–111)
Creatinine, Ser: 1.18 mg/dL (ref 0.61–1.24)
GFR calc Af Amer: >60 mL/min (ref >60)
GFR calc non Af Amer: >60 mL/min (ref >60)
Glucose, Bld: 103 mg/dL — ABNORMAL HIGH (ref 70–99)
Potassium: 4 mmol/L (ref 3.5–5.1)
Sodium: 136 mmol/L (ref 135–145)

## 2019-11-24 LAB — LACTIC ACID, PLASMA: Lactic Acid, Venous: 1.3 mmol/L (ref 0.5–1.9)

## 2019-11-24 LAB — HIV ANTIBODY (ROUTINE TESTING W REFLEX): HIV Screen 4th Generation wRfx: NONREACTIVE

## 2019-11-24 LAB — POC SARS CORONAVIRUS 2 AG -  ED: SARS Coronavirus 2 Ag: NEGATIVE

## 2019-11-24 LAB — SARS CORONAVIRUS 2 (TAT 6-24 HRS): SARS Coronavirus 2: NEGATIVE

## 2019-11-24 LAB — HEPARIN LEVEL (UNFRACTIONATED): Heparin Unfractionated: 0.24 IU/mL — ABNORMAL LOW (ref 0.30–0.70)

## 2019-11-24 MED ORDER — SODIUM CHLORIDE 0.9 % IV BOLUS
1000.0000 mL | Freq: Once | INTRAVENOUS | Status: AC
  Administered 2019-11-24: 1000 mL via INTRAVENOUS

## 2019-11-24 MED ORDER — HEPARIN BOLUS VIA INFUSION
5700.0000 [IU] | Freq: Once | INTRAVENOUS | Status: AC
  Administered 2019-11-24: 5700 [IU] via INTRAVENOUS
  Filled 2019-11-24: qty 5700

## 2019-11-24 MED ORDER — ACETAMINOPHEN 650 MG RE SUPP: 650.0000 mg | Freq: Four times a day (QID) | RECTAL | Status: DC | PRN

## 2019-11-24 MED ORDER — SODIUM CHLORIDE 0.9 % IV SOLN: INTRAVENOUS | Status: AC

## 2019-11-24 MED ORDER — HEPARIN (PORCINE) 25000 UT/250ML-% IV SOLN
1900.0000 [IU]/h | INTRAVENOUS | Status: DC
  Administered 2019-11-24: 1400 [IU]/h via INTRAVENOUS
  Administered 2019-11-25: 1700 [IU]/h via INTRAVENOUS
  Administered 2019-11-25: 1600 [IU]/h via INTRAVENOUS
  Administered 2019-11-26: 1900 [IU]/h via INTRAVENOUS
  Filled 2019-11-24 (×4): qty 250

## 2019-11-24 MED ORDER — HYDROMORPHONE HCL 2 MG PO TABS
2.0000 mg | ORAL_TABLET | ORAL | Status: DC | PRN
  Administered 2019-11-24 – 2019-11-26 (×2): 2 mg via ORAL
  Filled 2019-11-24 (×2): qty 1

## 2019-11-24 MED ORDER — ACETAMINOPHEN 325 MG PO TABS
650.0000 mg | ORAL_TABLET | Freq: Once | ORAL | Status: AC
  Administered 2019-11-24: 650 mg via ORAL
  Filled 2019-11-24: qty 2

## 2019-11-24 MED ORDER — ASPIRIN EC 81 MG PO TBEC: 81.0000 mg | DELAYED_RELEASE_TABLET | Freq: Once | ORAL | Status: DC

## 2019-11-24 MED ORDER — ACETAMINOPHEN 325 MG PO TABS: 650.0000 mg | ORAL_TABLET | Freq: Four times a day (QID) | ORAL | Status: DC | PRN

## 2019-11-24 MED ORDER — HEPARIN BOLUS VIA INFUSION
2000.0000 [IU] | Freq: Once | INTRAVENOUS | Status: AC
  Administered 2019-11-24: 2000 [IU] via INTRAVENOUS
  Filled 2019-11-24: qty 2000

## 2019-11-24 MED ORDER — FENTANYL CITRATE (PF) 100 MCG/2ML IJ SOLN
50.0000 ug | Freq: Once | INTRAMUSCULAR | Status: AC
  Administered 2019-11-24: 50 ug via INTRAVENOUS
  Filled 2019-11-24: qty 2

## 2019-11-24 MED ORDER — IOHEXOL 350 MG/ML SOLN
80.0000 mL | Freq: Once | INTRAVENOUS | Status: AC | PRN
  Administered 2019-11-24: 80 mL via INTRAVENOUS

## 2019-11-24 MED ORDER — KETOROLAC TROMETHAMINE 30 MG/ML IJ SOLN
30.0000 mg | Freq: Four times a day (QID) | INTRAMUSCULAR | Status: DC | PRN
  Administered 2019-11-24 – 2019-11-26 (×4): 30 mg via INTRAVENOUS
  Filled 2019-11-24 (×4): qty 1

## 2019-11-24 MED ORDER — METOPROLOL TARTRATE 25 MG PO TABS: 25.0000 mg | ORAL_TABLET | Freq: Four times a day (QID) | ORAL | Status: DC | PRN
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
## 2019-11-24 NOTE — ED Notes (Signed)
Attempted to call report, asked to call back after shift change by 2W.

## 2019-11-24 NOTE — Progress Notes (Signed)
ANTICOAGULATION CONSULT NOTE - Initial Consult  Pharmacy Consult for Heparin Indication: pulmonary embolus  No Known Allergies  Patient Measurements: Height: 5\' 9"  (175.3 cm) Weight: 180 lb (81.6 kg) IBW/kg (Calculated) : 70.7 Heparin Dosing Weight: 81.6 kg   Vital Signs: Temp: 100.5 F (38.1 C) (01/19 1250) Temp Source: Oral (01/19 1250) BP: 134/101 (01/19 1530) Pulse Rate: 120 (01/19 1530)  Labs: Recent Labs    11/24/19 1314  HGB 15.8  HCT 47.8  PLT 281  CREATININE 1.18    Estimated Creatinine Clearance: 63.2 mL/min (by C-G formula based on SCr of 1.18 mg/dL).   Medical History: Past Medical History:  Diagnosis Date  . CAD (coronary artery disease) 2/12   prior lateral MI in February of 2012 with distal LCX with BMS and 99% distal LAD. S/P cath in June 2013 - stent is patent, mild LV dysfunction with EF of 50% - managed medically.   July 2013 HTN (hypertension)   . Hyperlipidemia   . Myocardial infarction (HCC)   . Snoring   . Tobacco dependence     Assessment: 64 y.o. male presenting with left rib pain and cough. CT was significant for multiple pulmonary emboli in the left upper and lower lobes. Patient was not on anticoagulation prior to this admission. CBC is within normal limits and Scr is slightly elevated at 1.18.  Goal of Therapy:  Heparin level 0.3-0.7 units/ml Monitor platelets by anticoagulation protocol: Yes   Plan:  Give 5700 units bolus x 1; then start heparin infusion at 1400 units/hr Check heparin level in 6 hours and daily while on heparin Continue to monitor H&H, platelet and s/sx of bleeding   76, PharmD  PGY1 Acute Care Pharmacy Resident [phone] 11/24/2019,4:18 PM

## 2019-11-24 NOTE — Progress Notes (Signed)
ANTICOAGULATION CONSULT NOTE   Pharmacy Consult for Heparin Indication: pulmonary embolus  No Known Allergies  Patient Measurements: Height: 5\' 9"  (175.3 cm) Weight: 180 lb (81.6 kg) IBW/kg (Calculated) : 70.7 Heparin Dosing Weight: 81.6 kg   Vital Signs: Temp: 97.9 F (36.6 C) (01/19 2317) Temp Source: Oral (01/19 1618) BP: 132/92 (01/19 2317) Pulse Rate: 93 (01/19 2317)  Labs: Recent Labs    11/24/19 1314 11/24/19 2051 11/24/19 2300  HGB 15.8  --   --   HCT 47.8  --   --   PLT 281  --   --   HEPARINUNFRC  --   --  0.24*  CREATININE 1.18  --   --   TROPONINIHS  --  10  --     Estimated Creatinine Clearance: 63.2 mL/min (by C-G formula based on SCr of 1.18 mg/dL).   Medical History: Past Medical History:  Diagnosis Date  . CAD (coronary artery disease) 2/12   prior lateral MI in February of 2012 with distal LCX with BMS and 99% distal LAD. S/P cath in June 2013 - stent is patent, mild LV dysfunction with EF of 50% - managed medically.   July 2013 HTN (hypertension)   . Hyperlipidemia   . Myocardial infarction (HCC)   . Snoring   . Tobacco dependence     Assessment: 64 y.o. male presenting with left rib pain and cough. CT was significant for multiple pulmonary emboli in the left upper and lower lobes. Patient was not on anticoagulation prior to this admission. CBC is within normal limits and Scr is slightly elevated at 1.18.  1/19 PM update:  Initial heparin level just below goal  Goal of Therapy:  Heparin level 0.3-0.7 units/ml Monitor platelets by anticoagulation protocol: Yes   Plan:  Heparin 2000 units re-bolus Inc heparin drip to 1600 units/hr Re-check heparin level in 8 hours Continue to monitor H&H, platelets and s/sx of bleeding   2/19, PharmD, BCPS Clinical Pharmacist Phone: [phone]

## 2019-11-24 NOTE — Discharge Instructions (Addendum)
64 year old male with history of MI, HTN, HLD comes in for 2 weeks of cough. 4 days ago after some strenuous activity, also with left rib pain. Now with shortness of breath as he is unable to take deep breaths.   He is tachycardic from 130-140s, tachypneic at 22. O2 sat ranging 88-96%. Temp 99.3 BP 151/57 Heart: tachycardia, regular rhythm. Lungs: short shallow breaths, no obvious adventitious lung sounds. Chest: no tenderness to palpation along the ribs Legs: no swelling  Rapid COVID negative.  Rib xray without fractures.  CXR with minimal bibasilar subsegmental atelectasis.  EKG sinus tachycardia, 134bpm, no significant ST changes, unchanged from prior ekg.    ?mucus plug, inadequate breaths causing hypoxic reading. However, given obvious pain, tachycardia, shortness of breath, cannot rule out PE.   Will discharge patient to the emergency department for further evaluation needed.

## 2019-11-24 NOTE — ED Notes (Signed)
Pt transported to CT at this time.

## 2019-11-24 NOTE — ED Triage Notes (Signed)
Pt arrives via POV from [facility] care sent here for eval of hypoxia, congested sounding cough, rib pain and hypoxia. Rapid COVID negative. Denies recent fever. Pt alert, oriented x4. XRAY with atelectasis. Sent here for r/o PE.

## 2019-11-24 NOTE — Progress Notes (Signed)
Preliminary note:  We were called by Dr Chipper Herb re this 64 y/o [place] man presenting with chest pain and found to have multiple pulmonary emboli. Incidental findings include hypercalcemia and an enlarging pancreatic mass, suggesting a mucinous pancreatic neoplasm. MRCP has been ordered and consult to GI surgery (Dr Donell Beers) was suggested. PTH and SPEP ordered and pending. Will add CA 19 in AM  Full note to follow.

## 2019-11-24 NOTE — ED Triage Notes (Signed)
Pt c/o lt rib pain. States did heavy lifting on Saturday and felt small pain to lt rib and then had a coughing spell and the pain became unbearable. Pt states can't take a deep breath. Pt states has had cough and congestion for over 2wks.
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
## 2019-11-24 NOTE — ED Notes (Signed)
Dinner Tray Ordered @ 1740. 

## 2019-11-24 NOTE — H&P (Addendum)
History and Physical    Daniel Bonilla ONG:[id number] DOB: [birth year] DOA: 11/24/2019  PCP: Tally Joe, MD   Patient coming from: Home  I have personally briefly reviewed patient's old medical records in [facility]  Chief Complaint: Chest pain  HPI: Daniel Bonilla is a 64 y.o. male with medical history significant of hypertension, CAD, presenting to the emergency department with sudden onset of left lower anterior chest wall pain that began suddenly 4 days ago.  He dry cough for about 2 weeks prior. Chest pain is sharp intermittent chest pains, worse with movement/breathing/laying flat. Somewhat relived with putting presses down on his chest. He denies fevers or chills, changes in taste or smell, abdominal complaints, or shortness of breath. No known hx of DVT/PE. Possible COVID contact on Dec 31.  Patient was evaluated at [facility] urgent care just prior to arrival to the ED, chest x-ray was negative for pneumothorax or infiltrate, sent to r/o PE.  ED Course: CT showed left sided sub segmental PE.  Hypercalcemia of 12.2, enlarged pancreatic tail mass compared to 2017  Review of Systems: As per HPI otherwise 10 point review of systems negative.      Past Medical History:  Diagnosis Date  . CAD (coronary artery disease) 2/12   prior lateral MI in February of 2012 with distal LCX with BMS and 99% distal LAD. S/P cath in June 2013 - stent is patent, mild LV dysfunction with EF of 50% - managed medically.   Marland Kitchen HTN (hypertension)   . Hyperlipidemia   . Myocardial infarction (HCC)   . Snoring   . Tobacco dependence     Past Surgical History:  Procedure Laterality Date  . ANKLE FRACTURE SURGERY    . BASAL CELL CARCINOMA EXCISION     eyelid, nose  . CORONARY STENT PLACEMENT    . HAND SURGERY    . I & D EXTREMITY Left 05/02/2014   Procedure: IRRIGATION AND DEBRIDEMENT LEFT LEG, LEFT ANKLE  AND RIGHT ARM LACERATION AND CLOSURE OF  LACERATIONS TO RIGHT ARM AND LEFT LEG;  Surgeon:  Kathryne Hitch, MD;  Location: [facility] OR;  Service: Orthopedics;  Laterality: Left;  . INCISION AND DRAINAGE OF WOUND N/A 05/02/2014   Procedure: IRRIGATION AND DEBRIDEMENT LIP LACERATION AND CLOSURE. CLOSURE OF RIGHT EAR LACERATION;  Surgeon: Glenna Fellows, MD;  Location: [facility] OR;  Service: Plastics;  Laterality: N/A;  . NECK SURGERY       reports that he quit smoking about 8 years ago. He does not have any smokeless tobacco history on file. He reports that he does not drink alcohol or use drugs.  No Known Allergies  No family history on file.   Prior to Admission medications   Medication Sig Start Date End Date Taking? Authorizing Provider  aspirin EC 81 MG tablet Take 81 mg by mouth once.   Yes [provider]  doxycycline (PERIOSTAT) 20 MG tablet Take 20 mg by mouth 2 (two) times daily. 11/18/19  Yes [provider]  nitroGLYCERIN (NITROSTAT) 0.4 MG SL tablet Place 1 tablet (0.4 mg total) under the tongue every 5 (five) minutes as needed for chest pain. 05/10/14  Yes [place], Peter M, MD  aspirin (BAYER ASPIRIN) 325 MG tablet Take 1 tablet (325 mg total) by mouth 2 (two) times daily. Patient not taking: Reported on 11/24/2019 05/04/14   Nonie Hoyer, PA-C  oxyCODONE (OXY IR/ROXICODONE) 5 MG immediate release tablet Take 1-2 tablets (5-10 mg total) by mouth every 6 (  six) hours as needed for severe pain. Patient not taking: Reported on 11/24/2019 05/04/14   Nat Christen, [place]    Physical Exam: Vitals:   11/24/19 1734 11/24/19 1800 11/24/19 1815 11/24/19 1820  BP: (!) 130/99 119/86 (!) 146/100   Pulse: (!) 118 (!) 116 (!) 116   Resp: (!) 30 (!) 38 (!) 27   Temp:      TempSrc:      SpO2: 100% 97% 91% 96%  Weight:      Height:        Constitutional: NAD, calm, comfortable Vitals:   11/24/19 1734 11/24/19 1800 11/24/19 1815 11/24/19 1820  BP: (!) 130/99 119/86 (!) 146/100   Pulse: (!) 118 (!) 116 (!) 116   Resp: (!) 30 (!) 38 (!) 27   Temp:      TempSrc:       SpO2: 100% 97% 91% 96%  Weight:      Height:       Eyes: PERRL, lids and conjunctivae normal ENMT: Mucous membranes are moist. Posterior pharynx clear of any exudate or lesions.Normal dentition.  Neck: normal, supple, no masses, no thyromegaly Respiratory: clear to auscultation bilaterally, no wheezing, no crackles. Normal respiratory effort. No accessory muscle use.  Mild tenderness on the left lower rib cage when pressing down. Cardiovascular: Tachycardia, regular rate and rhythm, no murmurs / rubs / gallops. No extremity edema. 2+ pedal pulses. No carotid bruits.  Abdomen: no tenderness, no masses palpated. No hepatosplenomegaly. Bowel sounds positive.  Musculoskeletal: no clubbing / cyanosis. No joint deformity upper and lower extremities. Good ROM, no contractures. Normal muscle tone.  Skin: no rashes, lesions, ulcers. No induration Neurologic: CN 2-12 grossly intact. Sensation intact, DTR normal. Strength 5/5 in all 4.  Psychiatric: Normal judgment and insight. Alert and oriented x 3. Normal mood.     Labs on Admission: I have personally reviewed following labs and imaging studies  CBC: Recent Labs  Lab 11/24/19 1314  WBC 16.5*  NEUTROABS 13.3*  HGB 15.8  HCT 47.8  MCV 88.0  PLT 814   Basic Metabolic Panel: Recent Labs  Lab 11/24/19 1314  NA 136  K 4.0  CL 103  CO2 21*  GLUCOSE 103*  BUN 14  CREATININE 1.18  CALCIUM 12.2*   GFR: Estimated Creatinine Clearance: 63.2 mL/min (by C-G formula based on SCr of 1.18 mg/dL). Liver Function Tests: No results for input(s): AST, ALT, ALKPHOS, BILITOT, PROT, ALBUMIN in the last 168 hours. No results for input(s): LIPASE, AMYLASE in the last 168 hours. No results for input(s): AMMONIA in the last 168 hours. Coagulation Profile: No results for input(s): INR, PROTIME in the last 168 hours. Cardiac Enzymes: No results for input(s): CKTOTAL, CKMB, CKMBINDEX, TROPONINI in the last 168 hours. BNP (last 3 results) No results  for input(s): PROBNP in the last 8760 hours. HbA1C: No results for input(s): HGBA1C in the last 72 hours. CBG: No results for input(s): GLUCAP in the last 168 hours. Lipid Profile: No results for input(s): CHOL, HDL, LDLCALC, TRIG, CHOLHDL, LDLDIRECT in the last 72 hours. Thyroid Function Tests: No results for input(s): TSH, T4TOTAL, FREET4, T3FREE, THYROIDAB in the last 72 hours. Anemia Panel: No results for input(s): VITAMINB12, FOLATE, FERRITIN, TIBC, IRON, RETICCTPCT in the last 72 hours. Urine analysis: No results found for: COLORURINE, APPEARANCEUR, LABSPEC, [place], GLUCOSEU, HGBUR, BILIRUBINUR, KETONESUR, PROTEINUR, UROBILINOGEN, NITRITE, LEUKOCYTESUR  Radiological Exams on Admission: DG Chest 2 View  Result Date: 11/24/2019 CLINICAL DATA:  Productive cough. EXAM: CHEST -  2 VIEW COMPARISON:  May 02, 2014. FINDINGS: The heart size and mediastinal contours are within normal limits. No pneumothorax or pleural effusion is noted. Minimal bibasilar subsegmental atelectasis is noted. The visualized skeletal structures are unremarkable. IMPRESSION: Minimal bibasilar subsegmental atelectasis. Electronically Signed   By: Lupita Raider M.D.   On: 11/24/2019 11:26   DG Ribs Unilateral W/Chest Left  Result Date: 11/24/2019 CLINICAL DATA:  Acute left rib pain after lifting. EXAM: LEFT RIBS AND CHEST - 3+ VIEW COMPARISON:  None. FINDINGS: No fracture or other bone lesions are seen involving the ribs. There is no evidence of pneumothorax or pleural effusion. Minimal bibasilar subsegmental atelectasis is noted. Heart size and mediastinal contours are within normal limits. IMPRESSION: Negative. Electronically Signed   By: Lupita Raider M.D.   On: 11/24/2019 11:28   CT Angio Chest PE W/Cm &/Or Wo Cm  Result Date: 11/24/2019 CLINICAL DATA:  Hypoxia. Cough. EXAM: CT ANGIOGRAPHY CHEST WITH CONTRAST TECHNIQUE: Multidetector CT imaging of the chest was performed using the standard protocol during bolus  administration of intravenous contrast. Multiplanar CT image reconstructions and MIPs were obtained to evaluate the vascular anatomy. CONTRAST:  46mL OMNIPAQUE IOHEXOL 350 MG/ML SOLN COMPARISON:  Chest x-ray dated 11/24/2019 and chest CT dated 05/02/2014 FINDINGS: Cardiovascular: There are multiple pulmonary emboli in the left upper and lower lobes, most extensive in the lower lobe. No visible pulmonary emboli on the right. Heart size is normal. RV LV ratio is normal. No pericardial effusion. Mediastinum/Nodes: Thyroid gland and trachea appear normal. No hilar or mediastinal adenopathy. Small hiatal hernia. Lungs/Pleura: Extensive emphysematous changes, progressed since the prior CT scan. Hazy infiltrate at the left lung base posteriorly which could represent early pulmonary infarct. No effusions. Upper Abdomen: There is an enlarging low-density mass in the tail of the pancreas, increased from 12 mm to 22 mm since 2015. 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 I recommend MRI of the pancreas with and without contrast for further characterization. Stable 18 mm cyst in the dome of the right lobe of the liver. Musculoskeletal: No chest wall abnormality. No acute or significant osseous findings. Review of the MIP images confirms the above findings. IMPRESSION: 1. Multiple pulmonary emboli in the left upper and lower lobes. Normal RV LV ratio. 2. Possible early pulmonary infarct at the left lung base. 3. 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 I recommend MRI of the pancreas with and without contrast on an elective outpatient basis for further characterization. Electronically Signed   By: Francene Boyers M.D.   On: 11/24/2019 16:08    EKG: Independently reviewed.   Assessment/Plan Active Problems:   Pulmonary emboli (HCC)   Pulmonary embolism (HCC)  Left-sided subsegmental PE No significant known risk factor. Finding of  the CAT scan suspect pancreatic malignancy, thus suspect hypercoagulable state secondary to pancreatic mass.  Discussed with on-call oncologist, agreed with MRCP and who is very willing to talk to surgeon Dr. Almond Lint to call us about MRCP results and further plans. Check DVT study and echo. Heparin drip started in ED.  Hypocalcemia, discussed with oncologist recommend PTH and PTH like peptide, also serum protein electrophoresis. Aggressive hydration.  Sinus tachycardia, likely from the chest pain associated with PE, symptomatic management.  Metoprolol as needed, CTA did not show significant signs of right-sided straining/obstruction.  Leukocytosis, likely reactive from PE, hold off ABX.  CAD, no acute issue, cycle trop x2.  DVT prophylaxis: Heparin drip Code Status: Full code Family Communication: None at bedside Disposition Plan: Home Consults called: Neurologist Admission status: Telemetry admission   Emeline General MD [facility] Pager [phone]  If 7PM-7AM, please contact night-coverage [email or web address] Password [facility]  11/24/2019, 7:04 PM

## 2019-11-25 ENCOUNTER — Inpatient Hospital Stay (HOSPITAL_COMMUNITY): Payer: BC Managed Care – PPO

## 2019-11-25 DIAGNOSIS — R079 Chest pain, unspecified: Secondary | ICD-10-CM

## 2019-11-25 DIAGNOSIS — K869 Disease of pancreas, unspecified: Secondary | ICD-10-CM

## 2019-11-25 DIAGNOSIS — I2694 Multiple subsegmental pulmonary emboli without acute cor pulmonale: Principal | ICD-10-CM

## 2019-11-25 DIAGNOSIS — I2699 Other pulmonary embolism without acute cor pulmonale: Secondary | ICD-10-CM

## 2019-11-25 DIAGNOSIS — K8689 Other specified diseases of pancreas: Secondary | ICD-10-CM

## 2019-11-25 DIAGNOSIS — R0602 Shortness of breath: Secondary | ICD-10-CM

## 2019-11-25 DIAGNOSIS — I82402 Acute embolism and thrombosis of unspecified deep veins of left lower extremity: Secondary | ICD-10-CM

## 2019-11-25 LAB — BASIC METABOLIC PANEL
Anion gap: 8 (ref 5–15)
BUN: 15 mg/dL (ref 8–23)
CO2: 24 mmol/L (ref 22–32)
Calcium: 11.1 mg/dL — ABNORMAL HIGH (ref 8.9–10.3)
Chloride: 104 mmol/L (ref 98–111)
Creatinine, Ser: 1.06 mg/dL (ref 0.61–1.24)
GFR calc Af Amer: >60 mL/min (ref >60)
GFR calc non Af Amer: >60 mL/min (ref >60)
Glucose, Bld: 84 mg/dL (ref 70–99)
Potassium: 4.4 mmol/L (ref 3.5–5.1)
Sodium: 136 mmol/L (ref 135–145)

## 2019-11-25 LAB — PTH, INTACT AND CALCIUM
Calcium, Total (PTH): 11.3 mg/dL — ABNORMAL HIGH (ref 8.6–10.2)
PTH: 122 pg/mL — ABNORMAL HIGH (ref 15–65)

## 2019-11-25 LAB — CBC
HCT: 36.8 % — ABNORMAL LOW (ref 39.0–52.0)
Hemoglobin: 12 g/dL — ABNORMAL LOW (ref 13.0–17.0)
MCH: 28.6 pg (ref 26.0–34.0)
MCHC: 32.6 g/dL (ref 30.0–36.0)
MCV: 87.6 fL (ref 80.0–100.0)
Platelets: 222 10*3/uL (ref 150–400)
RBC: 4.2 MIL/uL — ABNORMAL LOW (ref 4.22–5.81)
RDW: 12.6 % (ref 11.5–15.5)
WBC: 12.2 10*3/uL — ABNORMAL HIGH (ref 4.0–10.5)
nRBC: 0 % (ref 0.0–0.2)

## 2019-11-25 LAB — ECHOCARDIOGRAM COMPLETE
Height: 69 in
Weight: 2880 oz

## 2019-11-25 LAB — HEPARIN LEVEL (UNFRACTIONATED)
Heparin Unfractionated: 0.29 IU/mL — ABNORMAL LOW (ref 0.30–0.70)
Heparin Unfractionated: 0.14 IU/mL — ABNORMAL LOW (ref 0.30–0.70)

## 2019-11-25 MED ORDER — GUAIFENESIN-DM 100-10 MG/5ML PO SYRP
5.0000 mL | ORAL_SOLUTION | ORAL | Status: DC | PRN
  Administered 2019-11-25 – 2019-11-26 (×2): 5 mL via ORAL
  Filled 2019-11-25 (×2): qty 5

## 2019-11-25 MED ORDER — PHENOL 1.4 % MT LIQD
1.0000 | OROMUCOSAL | Status: DC | PRN
  Administered 2019-11-25: 1 via OROMUCOSAL
  Filled 2019-11-25: qty 177

## 2019-11-25 MED ORDER — GADOBUTROL 1 MMOL/ML IV SOLN
9.0000 mL | Freq: Once | INTRAVENOUS | Status: AC | PRN
  Administered 2019-11-25: 9 mL via INTRAVENOUS

## 2019-11-25 MED ORDER — HEPARIN BOLUS VIA INFUSION
2000.0000 [IU] | Freq: Once | INTRAVENOUS | Status: AC
  Administered 2019-11-25: 2000 [IU] via INTRAVENOUS
  Filled 2019-11-25: qty 2000

## 2019-11-25 NOTE — Progress Notes (Signed)
  Echocardiogram 2D Echocardiogram has been performed.  Daniel Bonilla A Daniel Bonilla 11/25/2019, 2:16 PM

## 2019-11-25 NOTE — Progress Notes (Signed)
ANTICOAGULATION CONSULT NOTE   Pharmacy Consult for Heparin Indication: pulmonary embolus  No Known Allergies  Patient Measurements: Height: 5\' 9"  (175.3 cm) Weight: 180 lb (81.6 kg) IBW/kg (Calculated) : 70.7 Heparin Dosing Weight: 81.6 kg   Vital Signs: Temp: 97.7 F (36.5 C) (01/20 0750) Temp Source: Oral (01/20 0750) BP: 136/98 (01/20 0750) Pulse Rate: 101 (01/20 0750)  Labs: Recent Labs    11/24/19 1314 11/24/19 2051 11/24/19 2300 11/25/19 0620 11/25/19 0747  HGB 15.8  --   --  12.0*  --   HCT 47.8  --   --  36.8*  --   PLT 281  --   --  222  --   HEPARINUNFRC  --   --  0.24*  --  0.29*  CREATININE 1.18  --   --  1.06  --   TROPONINIHS  --  10 11  --   --     Estimated Creatinine Clearance: 70.4 mL/min (by C-G formula based on SCr of 1.06 mg/dL).  Assessment: 64 y.o. male presenting with left rib pain and cough. CT was significant for multiple pulmonary emboli in the left upper and lower lobes. Patient was not on anticoagulation prior to this admission.   Hep lvl slightly low at 0.29  Goal of Therapy:  Heparin level 0.3-0.7 units/ml Monitor platelets by anticoagulation protocol: Yes   Plan:  Increase heparin lvl 1700 units/hr 1800 hep lvl  76, PharmD, BCPS, BCCCP Clinical Pharmacist [phone]  Please check AMION for all [facility] numbers  11/25/2019 8:52 AM

## 2019-11-25 NOTE — Progress Notes (Signed)
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 above for measurements and observations. Electronically signed by Gretta Began MD on 11/25/2019 at 3:02:59 PM.    Final      Scheduled Meds: Continuous Infusions: . sodium chloride 125 mL/hr at 11/25/19 1358  . heparin 1,700 Units/hr (11/25/19 0903)     LOS: 1 day    Time spent: 25 minutes spent in the coordination of care today.    Teddy Spike, DO [facility]  If 7PM-7AM, please contact night-coverage [email or web address] 11/25/2019, 4:31 PM

## 2019-11-25 NOTE — Plan of Care (Signed)
°  Problem: Education: °Goal: Knowledge of disease or condition will improve °Outcome: Progressing °Goal: Knowledge of the prescribed therapeutic regimen will improve °Outcome: Progressing °Goal: Individualized Educational Video(s) °Outcome: Progressing °  °Problem: Respiratory: °Goal: Ability to maintain a clear airway will improve °Outcome: Progressing °Goal: Levels of oxygenation will improve °Outcome: Progressing °Goal: Ability to maintain adequate ventilation will improve °Outcome: Progressing °  °Problem: Activity: °Goal: Ability to tolerate increased activity will improve °Outcome: Progressing °Goal: Will verbalize the importance of balancing activity with adequate rest periods °Outcome: Progressing °  °

## 2019-11-25 NOTE — Progress Notes (Signed)
ANTICOAGULATION CONSULT NOTE   Pharmacy Consult for Heparin Indication: pulmonary embolus  No Known Allergies  Patient Measurements: Height: 5\' 9"  (175.3 cm) Weight: 180 lb (81.6 kg) IBW/kg (Calculated) : 70.7 Heparin Dosing Weight: 81.6 kg   Vital Signs: Temp: 99.5 F (37.5 C) (01/20 1708) Temp Source: Oral (01/20 1708) BP: 132/93 (01/20 1708) Pulse Rate: 61 (01/20 1708)  Labs: Recent Labs    11/24/19 1314 11/24/19 2051 11/24/19 2300 11/25/19 0620 11/25/19 0747 11/25/19 1752  HGB 15.8  --   --  12.0*  --   --   HCT 47.8  --   --  36.8*  --   --   PLT 281  --   --  222  --   --   HEPARINUNFRC  --   --  0.24*  --  0.29* 0.14*  CREATININE 1.18  --   --  1.06  --   --   TROPONINIHS  --  10 11  --   --   --     Estimated Creatinine Clearance: 70.4 mL/min (by C-G formula based on SCr of 1.06 mg/dL).  Assessment: 64 y.o. male presenting with left rib pain and cough. CT was significant for multiple pulmonary emboli in the left upper and lower lobes. Patient was not on anticoagulation prior to this admission.   Heparin drip 1700 uts/hr HL fell from 0.29> 0.14 this evening.    Goal of Therapy:  Heparin level 0.3-0.7 units/ml Monitor platelets by anticoagulation protocol: Yes   Plan:  Heparin bolus 2000 utsIV x1 then  Increase heparin drip 1900 units/hr Daily HL, CBC  77 Pharm.D. CPP, BCPS Clinical Pharmacist [phone] 11/25/2019 9:04 PM    Please check AMION for all [facility] Pharmacy numbers  11/25/2019 9:02 PM

## 2019-11-25 NOTE — Progress Notes (Signed)
Bilateral lower extremity duplex has been performed.  Gave results to Eber Jones, RN and messaged Dr. Delila Pereyra.  Preliminary results can be found under CV proc under chart review.  11/25/2019 2:38 PM  Gadiel John, K., RDMS, RVT

## 2019-11-25 NOTE — Progress Notes (Signed)
ADDENDUM: PTH elevated. ASPEP likely to be WNL.  Will consult on pancreatic mass-- waiting on MRI.  Full note to follow.  GM
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
## 2019-11-25 NOTE — Plan of Care (Signed)
Patient arrive to unit A&Ox4. Wife at bedside. Initial assessment reveals all vitals WDL. Centralized telemetry initiated. Heparin drip maintained. Will continued to observe patient for any changes in baseline throughout the night.

## 2019-11-26 ENCOUNTER — Telehealth: Payer: Self-pay | Admitting: Adult Health

## 2019-11-26 LAB — RENAL FUNCTION PANEL
Albumin: 2.4 g/dL — ABNORMAL LOW (ref 3.5–5.0)
Anion gap: 7 (ref 5–15)
BUN: 13 mg/dL (ref 8–23)
CO2: 25 mmol/L (ref 22–32)
Calcium: 10.7 mg/dL — ABNORMAL HIGH (ref 8.9–10.3)
Chloride: 108 mmol/L (ref 98–111)
Creatinine, Ser: 1.01 mg/dL (ref 0.61–1.24)
GFR calc Af Amer: >60 mL/min (ref >60)
GFR calc non Af Amer: >60 mL/min (ref >60)
Glucose, Bld: 77 mg/dL (ref 70–99)
Phosphorus: 2.3 mg/dL — ABNORMAL LOW (ref 2.5–4.6)
Potassium: 4.2 mmol/L (ref 3.5–5.1)
Sodium: 140 mmol/L (ref 135–145)

## 2019-11-26 LAB — PROTEIN ELECTROPHORESIS, SERUM
A/G Ratio: 0.9 (ref 0.7–1.7)
Albumin ELP: 3.1 g/dL (ref 2.9–4.4)
Alpha-1-Globulin: 0.5 g/dL — ABNORMAL HIGH (ref 0.0–0.4)
Alpha-2-Globulin: 0.9 g/dL (ref 0.4–1.0)
Beta Globulin: 1 g/dL (ref 0.7–1.3)
Gamma Globulin: 0.9 g/dL (ref 0.4–1.8)
Globulin, Total: 3.3 g/dL (ref 2.2–3.9)
Total Protein ELP: 6.4 g/dL (ref 6.0–8.5)

## 2019-11-26 LAB — CBC
HCT: 38.2 % — ABNORMAL LOW (ref 39.0–52.0)
Hemoglobin: 12.4 g/dL — ABNORMAL LOW (ref 13.0–17.0)
MCH: 28.4 pg (ref 26.0–34.0)
MCHC: 32.5 g/dL (ref 30.0–36.0)
MCV: 87.6 fL (ref 80.0–100.0)
Platelets: 245 10*3/uL (ref 150–400)
RBC: 4.36 MIL/uL (ref 4.22–5.81)
RDW: 12.7 % (ref 11.5–15.5)
WBC: 10.6 10*3/uL — ABNORMAL HIGH (ref 4.0–10.5)
nRBC: 0 % (ref 0.0–0.2)

## 2019-11-26 LAB — PSA: Prostatic Specific Antigen: 0.56 ng/mL (ref 0.00–4.00)

## 2019-11-26 LAB — CANCER ANTIGEN 19-9: CA 19-9: 7 U/mL (ref 0–35)

## 2019-11-26 LAB — HEPARIN LEVEL (UNFRACTIONATED)
Heparin Unfractionated: 0.52 IU/mL (ref 0.30–0.70)
Heparin Unfractionated: 0.36 IU/mL (ref 0.30–0.70)

## 2019-11-26 LAB — MAGNESIUM: Magnesium: 2.3 mg/dL (ref 1.7–2.4)

## 2019-11-26 MED ORDER — APIXABAN 5 MG PO TABS: 5.0000 mg | ORAL_TABLET | Freq: Two times a day (BID) | ORAL | Status: DC

## 2019-11-26 MED ORDER — APIXABAN 5 MG PO TABS
10.0000 mg | ORAL_TABLET | Freq: Two times a day (BID) | ORAL | Status: DC
  Administered 2019-11-26 – 2019-11-27 (×3): 10 mg via ORAL
  Filled 2019-11-26 (×3): qty 2

## 2019-11-26 NOTE — Progress Notes (Signed)
ANTICOAGULATION CONSULT NOTE - Follow Up Consult  Pharmacy Consult for heparin Indication: pulmonary embolus  Labs: Recent Labs    11/24/19 1314 11/24/19 1314 11/24/19 2051 11/24/19 2300 11/24/19 2300 11/25/19 0620 11/25/19 0747 11/25/19 1752 11/26/19 0223  HGB 15.8   < >  --   --   --  12.0*  --   --  12.4*  HCT 47.8  --   --   --   --  36.8*  --   --  38.2*  PLT 281  --   --   --   --  222  --   --  245  HEPARINUNFRC  --   --   --  0.24*   < >  --  0.29* 0.14* 0.52  CREATININE 1.18  --   --   --   --  1.06  --   --  1.01  TROPONINIHS  --   --  10 11  --   --   --   --   --    < > = values in this interval not displayed.    Assessment/Plan:  64yo male therapeutic on heparin after rate change. Will continue gtt at current rate and confirm stable with additional level.   Vernard Gambles, PharmD, BCPS  11/26/2019,3:38 AM

## 2019-11-26 NOTE — Care Management (Signed)
Per Merlene Laughter W/Prime  Co-pay amount for Xarelto 15mg .bid $20.00  20 mg daily $20.00 for 30 day supply.  Eliquis 10mg . bid $20.00  5 mg daily $20.00 for 30 day supply.(Eliquis only comes in 5mg .).  No PA required No Deductible  Tier 3 Retail Pharm[facility]: [place].

## 2019-11-26 NOTE — Progress Notes (Signed)
Daniel Bonilla.  PROGRESS NOTE    Daniel Bonilla  RUE:[id number] DOB: August 30, 1956 DOA: 11/24/2019 PCP: Tally JoeSwayne, David, MD   Brief Narrative:   Daniel Bonilla a 64 y.o.malewith medical history significant ofhypertension, CAD, presenting to the emergency department with sudden onset of left lower anterior chest wall pain that began suddenly4 days ago. Hedry cough for about 2 weeks prior. Chest pain is sharp intermittent chest pains, worse with movement/breathing/laying flat.Somewhatrelived with puttingpresses down on his chest.He denies fevers or chills, changes in taste or smell, abdominal complaints, or shortness of breath. No known hx of DVT/PE. Possible COVID contact on Dec 31. Patient was evaluated at [facility] urgent care just prior to arrival to the ED, chest x-ray was negative for pneumothorax or infiltrate, sent to r/o PE.  11/26/19: Still dyspneic when placed on RA. Needs 6MWT so he can qualify for O2. [facility]. Hopefully can d/c in next day or as as we secure O2 for him.    Assessment & Plan:   Active Problems:   Pulmonary emboli (HCC)   Pulmonary embolism (HCC)  Left-sided subsegmental PE LLE DVT     - on heparin gtt; continue     - Hx of pancreatic mass (seen on CT in 2015)     - needs 6MWT for home O2     - has chose [facility], pharm consult  Pancreatic Mass     - s/p MRCP     - onco onboard, appreciate assistance     - they will d/w Dr. Rose FillersBryerly     - follow up outpt w/ onco/surgery/GI (need C-scope)  Hypercalcemia     - elevated PTH     - SPEP pending     - per onco, will need follow up with ENT  Sinus tachycardia      - likely from the chest pain associated with PE, symptomatic management     - Metoprolol as needed     - CTA did not show significant signs of right-sided straining/obstruction.     - see above  Leukocytosis     - improving, likely reactive; no abx for now  CAD     - no acute issue     - trop is flat     -  denies complaints  DVT prophylaxis: heparin gtt Code Status: FULL Family Communication: None at bedside   Disposition Plan: Switch to oral AC, secure O2, then d/c to home.   Consultants:   Oncology   ROS:  Denies CP, N, dyspnea, ab pain. Reports dyspnea. Remainder 10-pt ROS is negative for all not previously mentioned.  Subjective: "So they said it wasn't cancer."  Objective: Vitals:   11/25/19 0750 11/25/19 1708 11/26/19 0500 11/26/19 0810  BP: (!) 136/98 (!) 132/93 133/84 123/89  Pulse: (!) 101 61 (!) 103 97  Resp:   16 16  Temp: 97.7 F (36.5 C) 99.5 F (37.5 C) 98.8 F (37.1 C) 98.8 F (37.1 C)  TempSrc: Oral Oral Oral Oral  SpO2: 99% 90% 100% 98%  Weight:      Height:        Intake/Output Summary (Last 24 hours) at 11/26/2019 1513 Last data filed at 11/26/2019 0506 Gross per 24 hour  Intake --  Output 175 ml  Net -175 ml   Filed Weights   11/24/19 1251  Weight: 81.6 kg    Examination:  General: 64 y.o. male resting in bed in NAD Cardiovascular: RRR, +S1, S2, no  m/g/r, equal pulses throughout Respiratory: CTABL, no w/r/r, normal WOB GI: BS+, NDNT, soft MSK: No e/c/c Skin: No rashes, bruises, ulcerations noted Neuro: A&O x 3, no focal deficits Psyc: Appropriate interaction and affect, calm/cooperative   Data Reviewed: I have personally reviewed following labs and imaging studies.  CBC: Recent Labs  Lab 11/24/19 1314 11/25/19 0620 11/26/19 0223  WBC 16.5* 12.2* 10.6*  NEUTROABS 13.3*  --   --   HGB 15.8 12.0* 12.4*  HCT 47.8 36.8* 38.2*  MCV 88.0 87.6 87.6  PLT 281 222 409   Basic Metabolic Panel: Recent Labs  Lab 11/24/19 1314 11/24/19 1800 11/25/19 0620 11/26/19 0223  NA 136  --  136 140  K 4.0  --  4.4 4.2  CL 103  --  104 108  CO2 21*  --  24 25  GLUCOSE 103*  --  84 77  BUN 14  --  15 13  CREATININE 1.18  --  1.06 1.01  CALCIUM 12.2* 11.3* 11.1* 10.7*  MG  --   --   --  2.3  PHOS  --   --   --  2.3*   GFR: Estimated  Creatinine Clearance: 73.9 mL/min (by C-G formula based on SCr of 1.01 mg/dL). Liver Function Tests: Recent Labs  Lab 11/26/19 0223  ALBUMIN 2.4*   No results for input(s): LIPASE, AMYLASE in the last 168 hours. No results for input(s): AMMONIA in the last 168 hours. Coagulation Profile: No results for input(s): INR, PROTIME in the last 168 hours. Cardiac Enzymes: No results for input(s): CKTOTAL, CKMB, CKMBINDEX, TROPONINI in the last 168 hours. BNP (last 3 results) No results for input(s): PROBNP in the last 8760 hours. HbA1C: No results for input(s): HGBA1C in the last 72 hours. CBG: No results for input(s): GLUCAP in the last 168 hours. Lipid Profile: No results for input(s): CHOL, HDL, LDLCALC, TRIG, CHOLHDL, LDLDIRECT in the last 72 hours. Thyroid Function Tests: No results for input(s): TSH, T4TOTAL, FREET4, T3FREE, THYROIDAB in the last 72 hours. Anemia Panel: No results for input(s): VITAMINB12, FOLATE, FERRITIN, TIBC, IRON, RETICCTPCT in the last 72 hours. Sepsis Labs: Recent Labs  Lab 11/24/19 1336  LATICACIDVEN 1.3    Recent Results (from the past 240 hour(s))  Culture, blood (routine x 2)     Status: None (Preliminary result)   Collection Time: 11/24/19  1:40 PM   Specimen: BLOOD RIGHT HAND  Result Value Ref Range Status   Specimen Description BLOOD RIGHT HAND  Final   Special Requests   Final    BOTTLES DRAWN AEROBIC AND ANAEROBIC Blood Culture results may not be optimal due to an inadequate volume of blood received in culture bottles   Culture   Final    NO GROWTH 2 DAYS Performed at [facility], [place] [address], [place], [place] [address]    Report Status PENDING  Incomplete  Culture, blood (routine x 2)     Status: None (Preliminary result)   Collection Time: 11/24/19  1:41 PM   Specimen: BLOOD  Result Value Ref Range Status   Specimen Description BLOOD LEFT ANTECUBITAL  Final   Special Requests   Final    BOTTLES DRAWN AEROBIC AND ANAEROBIC  Blood Culture results may not be optimal due to an inadequate volume of blood received in culture bottles   Culture   Final    NO GROWTH 2 DAYS Performed at [facility], [place] [address], [place], [place] [address]  Report Status PENDING  Incomplete  SARS CORONAVIRUS 2 (TAT 6-24 HRS) Nasopharyngeal Nasopharyngeal Swab     Status: None   Collection Time: 11/24/19  2:22 PM   Specimen: Nasopharyngeal Swab  Result Value Ref Range Status   SARS Coronavirus 2 NEGATIVE NEGATIVE Final    Comment: (NOTE) SARS-CoV-2 target nucleic acids are NOT DETECTED. The SARS-CoV-2 RNA is generally detectable in upper and lower respiratory specimens during the acute phase of infection. Negative results do not preclude SARS-CoV-2 infection, do not rule out co-infections with other pathogens, and should not be used as the sole basis for treatment or other patient management decisions. Negative results must be combined with clinical observations, patient history, and epidemiological information. The expected result is Negative. Fact Sheet for Patients: HairSlick.no Fact Sheet for Healthcare Providers: quierodirigir.com This test is not yet approved or cleared by the [place] FDA and  has been authorized for detection and/or diagnosis of SARS-CoV-2 by FDA under an Emergency Use Authorization (EUA). This EUA will remain  in effect (meaning this test can be used) for the duration of the COVID-19 declaration under Section 56 4(b)(1) of the Act, 21 U.S.C. section 360bbb-3(b)(1), unless the authorization is terminated or revoked sooner. Performed at [facility], [address], [place], [place] [address]       Radiology Studies: CT Angio Chest PE W/Cm &/Or Wo Cm  Result Date: 11/24/2019 CLINICAL DATA:  Hypoxia. Cough. EXAM: CT ANGIOGRAPHY CHEST WITH CONTRAST TECHNIQUE: Multidetector CT imaging of the chest was performed using the  standard protocol during bolus administration of intravenous contrast. Multiplanar CT image reconstructions and MIPs were obtained to evaluate the vascular anatomy. CONTRAST:  81mL OMNIPAQUE IOHEXOL 350 MG/ML SOLN COMPARISON:  Chest x-ray dated 11/24/2019 and chest CT dated 05/02/2014 FINDINGS: Cardiovascular: There are multiple pulmonary emboli in the left upper and lower lobes, most extensive in the lower lobe. No visible pulmonary emboli on the right. Heart size is normal. RV LV ratio is normal. No pericardial effusion. Mediastinum/Nodes: Thyroid gland and trachea appear normal. No hilar or mediastinal adenopathy. Small hiatal hernia. Lungs/Pleura: Extensive emphysematous changes, progressed since the prior CT scan. Hazy infiltrate at the left lung base posteriorly which could represent early pulmonary infarct. No effusions. Upper Abdomen: There is an enlarging low-density mass in the tail of the pancreas, increased from 12 mm to 22 mm since 2015. 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 I recommend MRI of the pancreas with and without contrast for further characterization. Stable 18 mm cyst in the dome of the right lobe of the liver. Musculoskeletal: No chest wall abnormality. No acute or significant osseous findings. Review of the MIP images confirms the above findings. IMPRESSION: 1. Multiple pulmonary emboli in the left upper and lower lobes. Normal RV LV ratio. 2. Possible early pulmonary infarct at the left lung base. 3. 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 I recommend MRI of the pancreas with and without contrast on an elective outpatient basis for further characterization. Electronically Signed   By: Francene Boyers M.D.   On: 11/24/2019 16:08   MR 3D Recon At Scanner  Result Date: 11/25/2019 CLINICAL DATA:  Hypodense pancreatic tail lesion on recent CT for further characterization. EXAM: MRI ABDOMEN WITHOUT AND  WITH CONTRAST (INCLUDING MRCP) TECHNIQUE: Multiplanar multisequence MR imaging of the abdomen was performed both before and after the administration of intravenous contrast. Heavily T2-weighted images of the biliary  and pancreatic ducts were obtained, and three-dimensional MRCP images were rendered by post processing. CONTRAST:  9mL GADAVIST GADOBUTROL 1 MMOL/ML IV SOLN COMPARISON:  Multiple exams, including CT chest 11/24/2019 and MRI abdomen from 12/30/2015. FINDINGS: Lower chest: Airspace opacity along the left hemidiaphragm, likely with a component of atelectasis. Small type 1 hiatal hernia. Hepatobiliary: 1.7 by 1.8 cm cyst in segment 4a of the liver near the IVC common no change from 12/30/2015. Otherwise unremarkable. Pancreas: Unilocular cystic nonenhancing lesion in the tail the pancreas measuring 2.1 by 1.9 by 1.9 cm, no internal nodularity. By my measurements this lesion was previously 1.9 by 1.8 by 1.6 cm on 12/30/2015. Definite connectivity to the dorsal pancreatic duct is not established. There is no dilatation of the dorsal pancreatic duct. No other pancreatic lesions are identified. Spleen:  Unremarkable Adrenals/Urinary Tract:  Unremarkable Stomach/Bowel: Small type 1 hiatal hernia. Vascular/Lymphatic:  Unremarkable Other:  No supplemental non-categorized findings. Musculoskeletal: Unremarkable IMPRESSION: 1. The cystic lesion of concern in the tail the pancreas currently measures 2.1 by 1.9 by 1.9 cm. No internal nodularity or other worrisome features. This lesion has mildly increased in size from 2015 where it measured about 1.6 by 1.3 by 1.4 cm, and 2017 where I measure the lesion at 1.9 by 1.8 by 1.6 cm. This qualifies as interval growth -barely-compared to the 2015 exam, but the lesion is still less than 2.5 cm in diameter. Possibilities include indolent intraductal papillary mucinous neoplasm, or a postinflammatory benign cyst. Based on current guidelines, follow pancreatic protocol MRI is  recommended in 6 months time for surveillance. This recommendation follows ACR consensus guidelines: Management of Incidental Pancreatic Cysts: A White Paper of the ACR Incidental Findings Committee. J Am Coll Radiol 2017;14:911-923. 2. Airspace opacity along the left hemidiaphragm, likely with a component of atelectasis. 3. Small type 1 hiatal hernia. Electronically Signed   By: Gaylyn RongWalter  Liebkemann M.D.   On: 11/25/2019 12:15   MR ABDOMEN MRCP W WO CONTAST  Result Date: 11/25/2019 CLINICAL DATA:  Hypodense pancreatic tail lesion on recent CT for further characterization. EXAM: MRI ABDOMEN WITHOUT AND WITH CONTRAST (INCLUDING MRCP) TECHNIQUE: Multiplanar multisequence MR imaging of the abdomen was performed both before and after the administration of intravenous contrast. Heavily T2-weighted images of the biliary and pancreatic ducts were obtained, and three-dimensional MRCP images were rendered by post processing. CONTRAST:  9mL GADAVIST GADOBUTROL 1 MMOL/ML IV SOLN COMPARISON:  Multiple exams, including CT chest 11/24/2019 and MRI abdomen from 12/30/2015. FINDINGS: Lower chest: Airspace opacity along the left hemidiaphragm, likely with a component of atelectasis. Small type 1 hiatal hernia. Hepatobiliary: 1.7 by 1.8 cm cyst in segment 4a of the liver near the IVC common no change from 12/30/2015. Otherwise unremarkable. Pancreas: Unilocular cystic nonenhancing lesion in the tail the pancreas measuring 2.1 by 1.9 by 1.9 cm, no internal nodularity. By my measurements this lesion was previously 1.9 by 1.8 by 1.6 cm on 12/30/2015. Definite connectivity to the dorsal pancreatic duct is not established. There is no dilatation of the dorsal pancreatic duct. No other pancreatic lesions are identified. Spleen:  Unremarkable Adrenals/Urinary Tract:  Unremarkable Stomach/Bowel: Small type 1 hiatal hernia. Vascular/Lymphatic:  Unremarkable Other:  No supplemental non-categorized findings. Musculoskeletal: Unremarkable  IMPRESSION: 1. The cystic lesion of concern in the tail the pancreas currently measures 2.1 by 1.9 by 1.9 cm. No internal nodularity or other worrisome features. This lesion has mildly increased in size from 2015 where it measured about 1.6 by 1.3 by 1.4 cm, and 2017  where I measure the lesion at 1.9 by 1.8 by 1.6 cm. This qualifies as interval growth -barely-compared to the 2015 exam, but the lesion is still less than 2.5 cm in diameter. Possibilities include indolent intraductal papillary mucinous neoplasm, or a postinflammatory benign cyst. Based on current guidelines, follow pancreatic protocol MRI is recommended in 6 months time for surveillance. This recommendation follows ACR consensus guidelines: Management of Incidental Pancreatic Cysts: A White Paper of the ACR Incidental Findings Committee. J Am Coll Radiol 2017;14:911-923. 2. Airspace opacity along the left hemidiaphragm, likely with a component of atelectasis. 3. Small type 1 hiatal hernia. Electronically Signed   By: Gaylyn Rong M.D.   On: 11/25/2019 12:15   ECHOCARDIOGRAM COMPLETE  Result Date: 11/25/2019   ECHOCARDIOGRAM REPORT   Patient Name:   Daniel Bonilla Date of Exam: 11/25/2019 Medical Rec #:  [id number]    Height:       69.0 in Accession #:    [id number]   Weight:       180.0 lb Date of Birth:  1956-07-14   BSA:          1.98 m Patient Age:    63 years     BP:           136/98 mmHg Patient Gender: M            HR:           100 bpm. Exam Location:  Inpatient Procedure: 2D Echo Indications:    Pulmonary Embolus 415.19 / I26.99  History:        Patient has no prior history of Echocardiogram examinations. CAD                 and MI; Risk Factors:Tobacco dependence, Dyslipidemia and                 Hypertension.  Sonographer:    Leeroy Bock Turrentine Referring Phys: [id number] Emeline General IMPRESSIONS  1. Left ventricular ejection fraction, by visual estimation, is 60 to 65%. The left ventricle has normal function. There is no left ventricular  hypertrophy.  2. Left ventricular diastolic parameters are consistent with Grade I diastolic dysfunction (impaired relaxation).  3. The left ventricle has no regional wall motion abnormalities.  4. Global right ventricle has normal systolic function.The right ventricular size is normal. No increase in right ventricular wall thickness.  5. Left atrial size was normal.  6. Right atrial size was normal.  7. The mitral valve is normal in structure. No evidence of mitral valve regurgitation. No evidence of mitral stenosis.  8. The tricuspid valve is normal in structure.  9. The tricuspid valve is normal in structure. Tricuspid valve regurgitation is not demonstrated. 10. The aortic valve is normal in structure. Aortic valve regurgitation is not visualized. No evidence of aortic valve sclerosis or stenosis. 11. The pulmonic valve was normal in structure. Pulmonic valve regurgitation is not visualized. 12. The inferior vena cava is normal in size with greater than 50% respiratory variability, suggesting right atrial pressure of 3 mmHg. 13. Normal RV in the setting of PE. FINDINGS  Left Ventricle: Left ventricular ejection fraction, by visual estimation, is 60 to 65%. The left ventricle has normal function. The left ventricle has no regional wall motion abnormalities. There is no left ventricular hypertrophy. Left ventricular diastolic parameters are consistent with Grade I diastolic dysfunction (impaired relaxation). Normal left atrial pressure. Right Ventricle: The right ventricular size is normal. No increase in right ventricular wall thickness.  Global RV systolic function is has normal systolic function. Left Atrium: Left atrial size was normal in size. Right Atrium: Right atrial size was normal in size Pericardium: There is no evidence of pericardial effusion. Mitral Valve: The mitral valve is normal in structure. No evidence of mitral valve regurgitation. No evidence of mitral valve stenosis by observation. Tricuspid  Valve: The tricuspid valve is normal in structure. Tricuspid valve regurgitation is not demonstrated. Aortic Valve: The aortic valve is normal in structure. Aortic valve regurgitation is not visualized. The aortic valve is structurally normal, with no evidence of sclerosis or stenosis. Pulmonic Valve: The pulmonic valve was normal in structure. Pulmonic valve regurgitation is not visualized. Pulmonic regurgitation is not visualized. Aorta: The aortic root, ascending aorta and aortic arch are all structurally normal, with no evidence of dilitation or obstruction. Venous: The inferior vena cava is normal in size with greater than 50% respiratory variability, suggesting right atrial pressure of 3 mmHg. IAS/Shunts: No atrial level shunt detected by color flow Doppler. There is no evidence of a patent foramen ovale. No ventricular septal defect is seen or detected. There is no evidence of an atrial septal defect. Additional Comments: Normal RV in the setting of PE.  LEFT VENTRICLE PLAX 2D LVIDd:         4.40 cm  Diastology LVIDs:         3.35 cm  LV e' lateral:   13.30 cm/s LV PW:         1.10 cm  LV E/e' lateral: 6.2 LV IVS:        1.10 cm LVOT diam:     1.90 cm LV SV:         42 ml LV SV Index:   20.88 LVOT Area:     2.84 cm  RIGHT VENTRICLE RV S prime:     14.50 cm/s TAPSE (M-mode): 2.2 cm LEFT ATRIUM             Index       RIGHT ATRIUM           Index LA diam:        4.35 cm 2.20 cm/m  RA Area:     16.40 cm LA Vol (A2C):   56.8 ml 28.75 ml/m RA Volume:   38.10 ml  19.29 ml/m LA Vol (A4C):   53.5 ml 27.08 ml/m LA Biplane Vol: 56.1 ml 28.40 ml/m  AORTIC VALVE LVOT Vmax:   108.00 cm/s LVOT Vmean:  72.700 cm/s LVOT VTI:    0.168 m  AORTA Ao Root diam: 3.10 cm MITRAL VALVE MV Area (PHT): 4.49 cm              SHUNTS MV PHT:        49.01 msec            Systemic VTI:  0.17 m MV Decel Time: 169 msec              Systemic Diam: 1.90 cm MV E velocity: 83.10 cm/s  103 cm/s MV A velocity: 116.00 cm/s 70.3 cm/s MV E/A  ratio:  0.72        1.5  Donato Schultz MD Electronically signed by Donato Schultz MD Signature Date/Time: 11/25/2019/3:33:20 PM    Final    VAS [place] LOWER EXTREMITY VENOUS (DVT)  Result Date: 11/25/2019  Lower Venous Study Indications: SOB, and pulmonary embolism.  Risk Factors: Confirmed PE HTN, CAD, HLD, MI. Comparison Study: No prior study. Performing Technologist: Kennedy Bucker ARDMS,  RVT  Examination Guidelines: A complete evaluation includes B-mode imaging, spectral Doppler, color Doppler, and power Doppler as needed of all accessible portions of each vessel. Bilateral testing is considered an integral part of a complete examination. Limited examinations for reoccurring indications may be performed as noted.  +---------+---------------+---------+-----------+----------+--------------+ RIGHT    CompressibilityPhasicitySpontaneityPropertiesThrombus Aging +---------+---------------+---------+-----------+----------+--------------+ CFV      Full           Yes      Yes                                 +---------+---------------+---------+-----------+----------+--------------+ SFJ      Full                                                        +---------+---------------+---------+-----------+----------+--------------+ FV Prox  Full                                                        +---------+---------------+---------+-----------+----------+--------------+ FV Mid   Full                                                        +---------+---------------+---------+-----------+----------+--------------+ FV DistalFull                                                        +---------+---------------+---------+-----------+----------+--------------+ PFV      Full                                                        +---------+---------------+---------+-----------+----------+--------------+ POP      Full           Yes      Yes                                  +---------+---------------+---------+-----------+----------+--------------+ PTV      Full                                                        +---------+---------------+---------+-----------+----------+--------------+ PERO     Full                                                        +---------+---------------+---------+-----------+----------+--------------+   +---------+---------------+---------+-----------+----------+--------------+  LEFT     CompressibilityPhasicitySpontaneityPropertiesThrombus Aging +---------+---------------+---------+-----------+----------+--------------+ CFV      Full           Yes      Yes                                 +---------+---------------+---------+-----------+----------+--------------+ SFJ      Full                                                        +---------+---------------+---------+-----------+----------+--------------+ FV Prox  Full                                                        +---------+---------------+---------+-----------+----------+--------------+ FV Mid   Full                                                        +---------+---------------+---------+-----------+----------+--------------+ FV DistalFull                                                        +---------+---------------+---------+-----------+----------+--------------+ PFV      Full                                                        +---------+---------------+---------+-----------+----------+--------------+ POP      Full           Yes      Yes                                 +---------+---------------+---------+-----------+----------+--------------+ PTV      None                                         Acute          +---------+---------------+---------+-----------+----------+--------------+ PERO     Full                                                         +---------+---------------+---------+-----------+----------+--------------+ One PTV non compressible and no blood flow    Summary: Right: There is no evidence of deep vein thrombosis in the lower extremity. No cystic structure found in the popliteal fossa. Left: Findings consistent with acute deep vein thrombosis involving one left posterior tibial veins. No cystic structure found in  the popliteal fossa.  *See table(s) above for measurements and observations. Electronically signed by Gretta Began MD on 11/25/2019 at 3:02:59 PM.    Final      Scheduled Meds: . apixaban  10 mg Oral BID   Followed by  . [START ON 12/03/2019] apixaban  5 mg Oral BID   Continuous Infusions:   LOS: 2 days   Time spent: 25 minutes spent in the coordination of care today.    Teddy Spike, DO [facility]  If 7PM-7AM, please contact night-coverage [email or web address] 11/26/2019, 3:13 PM
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
## 2019-11-26 NOTE — Progress Notes (Signed)
SATURATION QUALIFICATIONS: (This note is used to comply with regulatory documentation for home oxygen)  Patient Saturations on Room Air at Rest = 93%  Patient Saturations on Room Air while Ambulating = 91%  Patient Saturations on NA Liters of oxygen while Ambulating = NA%  Please briefly explain why patient needs home oxygen:Patient mobilizing on RA with SpO2 maintained 90% or greater.  No current home O2 needs.   Sheran Lawless, [facility] [phone] 11/26/2019

## 2019-11-26 NOTE — Telephone Encounter (Signed)
Scheduled appt per 1/20 sch message - pt to get an updated schedule with discharge papers.

## 2019-11-27 ENCOUNTER — Other Ambulatory Visit: Payer: Self-pay | Admitting: Oncology

## 2019-11-27 DIAGNOSIS — I82462 Acute embolism and thrombosis of left calf muscular vein: Secondary | ICD-10-CM

## 2019-11-27 DIAGNOSIS — K8689 Other specified diseases of pancreas: Secondary | ICD-10-CM

## 2019-11-27 DIAGNOSIS — R000 Tachycardia, unspecified: Secondary | ICD-10-CM

## 2019-11-27 DIAGNOSIS — E210 Primary hyperparathyroidism: Secondary | ICD-10-CM | POA: Insufficient documentation

## 2019-11-27 DIAGNOSIS — I2602 Saddle embolus of pulmonary artery with acute cor pulmonale: Secondary | ICD-10-CM

## 2019-11-27 DIAGNOSIS — D72829 Elevated white blood cell count, unspecified: Secondary | ICD-10-CM

## 2019-11-27 DIAGNOSIS — E213 Hyperparathyroidism, unspecified: Secondary | ICD-10-CM

## 2019-11-27 HISTORY — DX: Primary hyperparathyroidism: E21.0

## 2019-11-27 HISTORY — DX: Other specified diseases of pancreas: K86.89

## 2019-11-27 LAB — CBC WITH DIFFERENTIAL/PLATELET
Abs Immature Granulocytes: 0.05 10*3/uL (ref 0.00–0.07)
Basophils Absolute: 0.1 10*3/uL (ref 0.0–0.1)
Basophils Relative: 1 %
Eosinophils Absolute: 0.4 10*3/uL (ref 0.0–0.5)
Eosinophils Relative: 4 %
HCT: 35.5 % — ABNORMAL LOW (ref 39.0–52.0)
Hemoglobin: 11.7 g/dL — ABNORMAL LOW (ref 13.0–17.0)
Immature Granulocytes: 1 %
Lymphocytes Relative: 15 %
Lymphs Abs: 1.3 10*3/uL (ref 0.7–4.0)
MCH: 28.9 pg (ref 26.0–34.0)
MCHC: 33 g/dL (ref 30.0–36.0)
MCV: 87.7 fL (ref 80.0–100.0)
Monocytes Absolute: 1.2 10*3/uL — ABNORMAL HIGH (ref 0.1–1.0)
Monocytes Relative: 13 %
Neutro Abs: 6.2 10*3/uL (ref 1.7–7.7)
Neutrophils Relative %: 66 %
Platelets: 322 10*3/uL (ref 150–400)
RBC: 4.05 MIL/uL — ABNORMAL LOW (ref 4.22–5.81)
RDW: 12.7 % (ref 11.5–15.5)
WBC: 9.2 10*3/uL (ref 4.0–10.5)
nRBC: 0 % (ref 0.0–0.2)

## 2019-11-27 LAB — RENAL FUNCTION PANEL
Albumin: 2.3 g/dL — ABNORMAL LOW (ref 3.5–5.0)
Anion gap: 8 (ref 5–15)
BUN: 12 mg/dL (ref 8–23)
CO2: 23 mmol/L (ref 22–32)
Calcium: 10.7 mg/dL — ABNORMAL HIGH (ref 8.9–10.3)
Chloride: 107 mmol/L (ref 98–111)
Creatinine, Ser: 0.97 mg/dL (ref 0.61–1.24)
GFR calc Af Amer: >60 mL/min (ref >60)
GFR calc non Af Amer: >60 mL/min (ref >60)
Glucose, Bld: 102 mg/dL — ABNORMAL HIGH (ref 70–99)
Phosphorus: 2.3 mg/dL — ABNORMAL LOW (ref 2.5–4.6)
Potassium: 3.6 mmol/L (ref 3.5–5.1)
Sodium: 138 mmol/L (ref 135–145)

## 2019-11-27 LAB — CEA: CEA: 1.1 ng/mL (ref 0.0–4.7)

## 2019-11-27 LAB — MAGNESIUM: Magnesium: 2.2 mg/dL (ref 1.7–2.4)

## 2019-11-27 MED ORDER — APIXABAN 5 MG PO TABS: 5.0000 mg | ORAL_TABLET | Freq: Two times a day (BID) | ORAL | 0 refills | Status: DC

## 2019-11-27 MED ORDER — APIXABAN 5 MG PO TABS: 10.0000 mg | ORAL_TABLET | Freq: Two times a day (BID) | ORAL | 0 refills | Status: DC

## 2019-11-27 NOTE — Progress Notes (Signed)
Patient is discharge home on self, discharge instruction given to patient. Education on Eliquis done, patient verbalizes understanding. Patient awaiting on his ride at this moment.

## 2019-11-27 NOTE — Progress Notes (Signed)
Reviewed films.  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  No acute intervention needed.  Appropriate imaging already in place.

## 2019-11-27 NOTE — Plan of Care (Signed)
  Problem: Education: Goal: Knowledge of disease or condition will improve Outcome: Adequate for Discharge Goal: Knowledge of the prescribed therapeutic regimen will improve Outcome: Adequate for Discharge Goal: Individualized Educational Video(s) Outcome: Adequate for Discharge   Problem: Activity: Goal: Ability to tolerate increased activity will improve Outcome: Adequate for Discharge Goal: Will verbalize the importance of balancing activity with adequate rest periods Outcome: Adequate for Discharge   Problem: Respiratory: Goal: Ability to maintain a clear airway will improve Outcome: Adequate for Discharge Goal: Levels of oxygenation will improve Outcome: Adequate for Discharge Goal: Ability to maintain adequate ventilation will improve Outcome: Adequate for Discharge   

## 2019-11-27 NOTE — Progress Notes (Signed)
Sign-off note: Patient's pancreatic mass will be followed by Dr Carmon Ginsberg. Donell Beers and his hyperparathyroidism by Dr Karie Schwalbe. Gerrit Friends-- he will be contacted by [facility] with those appts.  I will see him in about 2 months to discuss length of anticoagulation.  Please let me know if I can be of further assistance. Will sign off at this time.

## 2019-11-27 NOTE — Discharge Summary (Addendum)
. Physician Discharge Summary  Daniel Bonilla MVH:[id number] DOB: 12-Aug-1956 DOA: 11/24/2019  PCP: Tally Joe, MD  Admit date: 11/24/2019 Discharge date: 11/27/2019  Admitted From: Home Disposition:  Discharged to home.  Recommendations for Outpatient Follow-up:  1. Follow up with PCP in 1-2 weeks 2. Please obtain BMP/CBC in one week 3. Follow up with Dr. Marilynne Halsted for his pancreatic mass. 4. Follow up with Dr. Nat Christen for hyperparathyroidism.  Discharge Condition: Stable  CODE STATUS: FULL   Brief/Interim Summary: Daniel Bonilla a 64 y.o.malewith medical history significant ofhypertension, CAD, presenting to the emergency department with sudden onset of left lower anterior chest wall pain that began suddenly4 days ago. Hedry cough for about 2 weeks prior. Chest pain is sharp intermittent chest pains, worse with movement/breathing/laying flat.Somewhatrelived with puttingpresses down on his chest.He denies fevers or chills, changes in taste or smell, abdominal complaints, or shortness of breath. No known hx of DVT/PE. Possible COVID contact on Dec 31. Patient was evaluated at [facility] urgent care just prior to arrival to the ED, chest x-ray was negative for pneumothorax or infiltrate, sent to r/o PE.  11/27/19: Maintained  SpO2 between 91 - 93% while at rest and ambulating during . No home O2 needs at this time. No PT needs at this time. He has switched to eliquis. Needs follow up with Onco, PCP, GI and Surgery.   Discharge Diagnoses:  Active Problems:   Pulmonary emboli (HCC)   Pulmonary embolism (HCC)  Left-sided subsegmental PE LLE DVT - on heparin gtt; continue - Hx of pancreatic mass (seen on CT in 2015)     - is negative     - now on eliquis and stable  Pancreatic Mass - s/p MRCP - onco onboard, appreciate assistance - they will d/w Dr. Rose Fillers     - follow up outpt w/ onco/surgery/GI (needs C-scope)  Hypercalcemia -  elevated PTH - per onco, will need follow up with surgery for evaluation for parathyroid surgery  Sinus tachycardia  - likely from the chest pain associated with PE, symptomatic management - Metoprolol as needed - CTA did not show significant signs of right-sided straining/obstruction. - see above     - stable  Leukocytosis - improving, likely reactive; no abx for now     - resolved  CAD - no acute issue - trop is flat     - denies complaints     - has ASA 325 and 81 on his home med list. Says he hasn't been compliant with either. Sidebarred cards on continuing. Can hold in the light of him having full AC now. Follow up with PCP/cards.   Discharge Instructions   Allergies as of 11/27/2019   No Known Allergies     Medication List    STOP taking these medications   aspirin 325 MG tablet Commonly known as: Bayer Aspirin   aspirin EC 81 MG tablet   doxycycline 20 MG tablet Commonly known as: PERIOSTAT   oxyCODONE 5 MG immediate release tablet Commonly known as: Oxy IR/ROXICODONE     TAKE these medications   apixaban 5 MG Tabs tablet Commonly known as: ELIQUIS Take 2 tablets (10 mg total) by mouth 2 (two) times daily for 6 days.   apixaban 5 MG Tabs tablet Commonly known as: ELIQUIS Take 1 tablet (5 mg total) by mouth 2 (two) times daily. Start taking on: December 03, 2019   nitroGLYCERIN 0.4 MG SL tablet Commonly known as: NITROSTAT Place 1 tablet (0.4 mg  total) under the tongue every 5 (five) minutes as needed for chest pain.      Follow-up Information    Almond Lint, MD. Go on 12/04/2019.   Specialty: General Surgery Why: at 1145. Please arrive at 1115 for paperwork. Please bring a copy of your photo ID and insurance card.  Contact information: [address] [place] [address] [phone]        Darnell Level, MD. Go on 12/08/2019.   Specialty: General Surgery Why: 130pm. Please arrive 30 minutes  prior to your appointment for paperwork. Please bring a copy of your photo ID and insurance card to the appointment.  Contact information: [address] [place] [address] [phone]          No Known Allergies  Consultations:  Heme-onco   Procedures/Studies: DG Chest 2 View  Result Date: 11/24/2019 CLINICAL DATA:  Productive cough. EXAM: CHEST - 2 VIEW COMPARISON:  May 02, 2014. FINDINGS: The heart size and mediastinal contours are within normal limits. No pneumothorax or pleural effusion is noted. Minimal bibasilar subsegmental atelectasis is noted. The visualized skeletal structures are unremarkable. IMPRESSION: Minimal bibasilar subsegmental atelectasis. Electronically Signed   By: Lupita Raider M.D.   On: 11/24/2019 11:26   DG Ribs Unilateral W/Chest Left  Result Date: 11/24/2019 CLINICAL DATA:  Acute left rib pain after lifting. EXAM: LEFT RIBS AND CHEST - 3+ VIEW COMPARISON:  None. FINDINGS: No fracture or other bone lesions are seen involving the ribs. There is no evidence of pneumothorax or pleural effusion. Minimal bibasilar subsegmental atelectasis is noted. Heart size and mediastinal contours are within normal limits. IMPRESSION: Negative. Electronically Signed   By: Lupita Raider M.D.   On: 11/24/2019 11:28   CT Angio Chest PE W/Cm &/Or Wo Cm  Result Date: 11/24/2019 CLINICAL DATA:  Hypoxia. Cough. EXAM: CT ANGIOGRAPHY CHEST WITH CONTRAST TECHNIQUE: Multidetector CT imaging of the chest was performed using the standard protocol during bolus administration of intravenous contrast. Multiplanar CT image reconstructions and MIPs were obtained to evaluate the vascular anatomy. CONTRAST:  80mL OMNIPAQUE IOHEXOL 350 MG/ML SOLN COMPARISON:  Chest x-ray dated 11/24/2019 and chest CT dated 05/02/2014 FINDINGS: Cardiovascular: There are multiple pulmonary emboli in the left upper and lower lobes, most extensive in the lower lobe. No visible pulmonary emboli on the  right. Heart size is normal. RV LV ratio is normal. No pericardial effusion. Mediastinum/Nodes: Thyroid gland and trachea appear normal. No hilar or mediastinal adenopathy. Small hiatal hernia. Lungs/Pleura: Extensive emphysematous changes, progressed since the prior CT scan. Hazy infiltrate at the left lung base posteriorly which could represent early pulmonary infarct. No effusions. Upper Abdomen: There is an enlarging low-density mass in the tail of the pancreas, increased from 12 mm to 22 mm since 2015. 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 I recommend MRI of the pancreas with and without contrast for further characterization. Stable 18 mm cyst in the dome of the right lobe of the liver. Musculoskeletal: No chest wall abnormality. No acute or significant osseous findings. Review of the MIP images confirms the above findings. IMPRESSION: 1. Multiple pulmonary emboli in the left upper and lower lobes. Normal RV LV ratio. 2. Possible early pulmonary infarct at the left lung base. 3. 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 I recommend MRI of the pancreas with and without contrast on an elective outpatient basis for further characterization.  Electronically Signed   By: Francene BoyersJames  Maxwell M.D.   On: 11/24/2019 16:08   MR 3D Recon At Scanner  Result Date: 11/25/2019 CLINICAL DATA:  Hypodense pancreatic tail lesion on recent CT for further characterization. EXAM: MRI ABDOMEN WITHOUT AND WITH CONTRAST (INCLUDING MRCP) TECHNIQUE: Multiplanar multisequence MR imaging of the abdomen was performed both before and after the administration of intravenous contrast. Heavily T2-weighted images of the biliary and pancreatic ducts were obtained, and three-dimensional MRCP images were rendered by post processing. CONTRAST:  9mL GADAVIST GADOBUTROL 1 MMOL/ML IV SOLN COMPARISON:  Multiple exams, including CT chest 11/24/2019 and MRI abdomen  from 12/30/2015. FINDINGS: Lower chest: Airspace opacity along the left hemidiaphragm, likely with a component of atelectasis. Small type 1 hiatal hernia. Hepatobiliary: 1.7 by 1.8 cm cyst in segment 4a of the liver near the IVC common no change from 12/30/2015. Otherwise unremarkable. Pancreas: Unilocular cystic nonenhancing lesion in the tail the pancreas measuring 2.1 by 1.9 by 1.9 cm, no internal nodularity. By my measurements this lesion was previously 1.9 by 1.8 by 1.6 cm on 12/30/2015. Definite connectivity to the dorsal pancreatic duct is not established. There is no dilatation of the dorsal pancreatic duct. No other pancreatic lesions are identified. Spleen:  Unremarkable Adrenals/Urinary Tract:  Unremarkable Stomach/Bowel: Small type 1 hiatal hernia. Vascular/Lymphatic:  Unremarkable Other:  No supplemental non-categorized findings. Musculoskeletal: Unremarkable IMPRESSION: 1. The cystic lesion of concern in the tail the pancreas currently measures 2.1 by 1.9 by 1.9 cm. No internal nodularity or other worrisome features. This lesion has mildly increased in size from 2015 where it measured about 1.6 by 1.3 by 1.4 cm, and 2017 where I measure the lesion at 1.9 by 1.8 by 1.6 cm. This qualifies as interval growth -barely-compared to the 2015 exam, but the lesion is still less than 2.5 cm in diameter. Possibilities include indolent intraductal papillary mucinous neoplasm, or a postinflammatory benign cyst. Based on current guidelines, follow pancreatic protocol MRI is recommended in 6 months time for surveillance. This recommendation follows ACR consensus guidelines: Management of Incidental Pancreatic Cysts: A White Paper of the ACR Incidental Findings Committee. J Am Coll Radiol 2017;14:911-923. 2. Airspace opacity along the left hemidiaphragm, likely with a component of atelectasis. 3. Small type 1 hiatal hernia. Electronically Signed   By: Gaylyn RongWalter  Liebkemann M.D.   On: 11/25/2019 12:15   MR ABDOMEN MRCP W  WO CONTAST  Result Date: 11/25/2019 CLINICAL DATA:  Hypodense pancreatic tail lesion on recent CT for further characterization. EXAM: MRI ABDOMEN WITHOUT AND WITH CONTRAST (INCLUDING MRCP) TECHNIQUE: Multiplanar multisequence MR imaging of the abdomen was performed both before and after the administration of intravenous contrast. Heavily T2-weighted images of the biliary and pancreatic ducts were obtained, and three-dimensional MRCP images were rendered by post processing. CONTRAST:  9mL GADAVIST GADOBUTROL 1 MMOL/ML IV SOLN COMPARISON:  Multiple exams, including CT chest 11/24/2019 and MRI abdomen from 12/30/2015. FINDINGS: Lower chest: Airspace opacity along the left hemidiaphragm, likely with a component of atelectasis. Small type 1 hiatal hernia. Hepatobiliary: 1.7 by 1.8 cm cyst in segment 4a of the liver near the IVC common no change from 12/30/2015. Otherwise unremarkable. Pancreas: Unilocular cystic nonenhancing lesion in the tail the pancreas measuring 2.1 by 1.9 by 1.9 cm, no internal nodularity. By my measurements this lesion was previously 1.9 by 1.8 by 1.6 cm on 12/30/2015. Definite connectivity to the dorsal pancreatic duct is not established. There is no dilatation of the dorsal pancreatic duct. No other pancreatic lesions are identified.  Spleen:  Unremarkable Adrenals/Urinary Tract:  Unremarkable Stomach/Bowel: Small type 1 hiatal hernia. Vascular/Lymphatic:  Unremarkable Other:  No supplemental non-categorized findings. Musculoskeletal: Unremarkable IMPRESSION: 1. The cystic lesion of concern in the tail the pancreas currently measures 2.1 by 1.9 by 1.9 cm. No internal nodularity or other worrisome features. This lesion has mildly increased in size from 2015 where it measured about 1.6 by 1.3 by 1.4 cm, and 2017 where I measure the lesion at 1.9 by 1.8 by 1.6 cm. This qualifies as interval growth -barely-compared to the 2015 exam, but the lesion is still less than 2.5 cm in diameter. Possibilities  include indolent intraductal papillary mucinous neoplasm, or a postinflammatory benign cyst. Based on current guidelines, follow pancreatic protocol MRI is recommended in 6 months time for surveillance. This recommendation follows ACR consensus guidelines: Management of Incidental Pancreatic Cysts: A White Paper of the ACR Incidental Findings Committee. J Am Coll Radiol 2017;14:911-923. 2. Airspace opacity along the left hemidiaphragm, likely with a component of atelectasis. 3. Small type 1 hiatal hernia. Electronically Signed   By: Gaylyn Rong M.D.   On: 11/25/2019 12:15   ECHOCARDIOGRAM COMPLETE  Result Date: 11/25/2019   ECHOCARDIOGRAM REPORT   Patient Name:   Daniel Bonilla Date of Exam: 11/25/2019 Medical Rec #:  [id number]    Height:       69.0 in Accession #:    [id number]   Weight:       180.0 lb Date of Birth:  1956-07-27   BSA:          1.98 m Patient Age:    63 years     BP:           136/98 mmHg Patient Gender: M            HR:           100 bpm. Exam Location:  Inpatient Procedure: 2D Echo Indications:    Pulmonary Embolus 415.19 / I26.99  History:        Patient has no prior history of Echocardiogram examinations. CAD                 and MI; Risk Factors:Tobacco dependence, Dyslipidemia and                 Hypertension.  Sonographer:    Leeroy Bock Turrentine Referring Phys: [id number] Emeline General IMPRESSIONS  1. Left ventricular ejection fraction, by visual estimation, is 60 to 65%. The left ventricle has normal function. There is no left ventricular hypertrophy.  2. Left ventricular diastolic parameters are consistent with Grade I diastolic dysfunction (impaired relaxation).  3. The left ventricle has no regional wall motion abnormalities.  4. Global right ventricle has normal systolic function.The right ventricular size is normal. No increase in right ventricular wall thickness.  5. Left atrial size was normal.  6. Right atrial size was normal.  7. The mitral valve is normal in structure. No  evidence of mitral valve regurgitation. No evidence of mitral stenosis.  8. The tricuspid valve is normal in structure.  9. The tricuspid valve is normal in structure. Tricuspid valve regurgitation is not demonstrated. 10. The aortic valve is normal in structure. Aortic valve regurgitation is not visualized. No evidence of aortic valve sclerosis or stenosis. 11. The pulmonic valve was normal in structure. Pulmonic valve regurgitation is not visualized. 12. The inferior vena cava is normal in size with greater than 50% respiratory variability, suggesting right atrial pressure of 3 mmHg. 13. Normal RV  in the setting of PE. FINDINGS  Left Ventricle: Left ventricular ejection fraction, by visual estimation, is 60 to 65%. The left ventricle has normal function. The left ventricle has no regional wall motion abnormalities. There is no left ventricular hypertrophy. Left ventricular diastolic parameters are consistent with Grade I diastolic dysfunction (impaired relaxation). Normal left atrial pressure. Right Ventricle: The right ventricular size is normal. No increase in right ventricular wall thickness. Global RV systolic function is has normal systolic function. Left Atrium: Left atrial size was normal in size. Right Atrium: Right atrial size was normal in size Pericardium: There is no evidence of pericardial effusion. Mitral Valve: The mitral valve is normal in structure. No evidence of mitral valve regurgitation. No evidence of mitral valve stenosis by observation. Tricuspid Valve: The tricuspid valve is normal in structure. Tricuspid valve regurgitation is not demonstrated. Aortic Valve: The aortic valve is normal in structure. Aortic valve regurgitation is not visualized. The aortic valve is structurally normal, with no evidence of sclerosis or stenosis. Pulmonic Valve: The pulmonic valve was normal in structure. Pulmonic valve regurgitation is not visualized. Pulmonic regurgitation is not visualized. Aorta: The  aortic root, ascending aorta and aortic arch are all structurally normal, with no evidence of dilitation or obstruction. Venous: The inferior vena cava is normal in size with greater than 50% respiratory variability, suggesting right atrial pressure of 3 mmHg. IAS/Shunts: No atrial level shunt detected by color flow Doppler. There is no evidence of a patent foramen ovale. No ventricular septal defect is seen or detected. There is no evidence of an atrial septal defect. Additional Comments: Normal RV in the setting of PE.  LEFT VENTRICLE PLAX 2D LVIDd:         4.40 cm  Diastology LVIDs:         3.35 cm  LV e' lateral:   13.30 cm/s LV PW:         1.10 cm  LV E/e' lateral: 6.2 LV IVS:        1.10 cm LVOT diam:     1.90 cm LV SV:         42 ml LV SV Index:   20.88 LVOT Area:     2.84 cm  RIGHT VENTRICLE RV S prime:     14.50 cm/s TAPSE (M-mode): 2.2 cm LEFT ATRIUM             Index       RIGHT ATRIUM           Index LA diam:        4.35 cm 2.20 cm/m  RA Area:     16.40 cm LA Vol (A2C):   56.8 ml 28.75 ml/m RA Volume:   38.10 ml  19.29 ml/m LA Vol (A4C):   53.5 ml 27.08 ml/m LA Biplane Vol: 56.1 ml 28.40 ml/m  AORTIC VALVE LVOT Vmax:   108.00 cm/s LVOT Vmean:  72.700 cm/s LVOT VTI:    0.168 m  AORTA Ao Root diam: 3.10 cm MITRAL VALVE MV Area (PHT): 4.49 cm              SHUNTS MV PHT:        49.01 msec            Systemic VTI:  0.17 m MV Decel Time: 169 msec              Systemic Diam: 1.90 cm MV E velocity: 83.10 cm/s  103 cm/s MV A velocity: 116.00 cm/s 70.3 cm/s  MV E/A ratio:  0.72        1.5  Candee Furbish MD Electronically signed by Candee Furbish MD Signature Date/Time: 11/25/2019/3:33:20 PM    Final    VAS [place] LOWER EXTREMITY VENOUS (DVT)  Result Date: 11/25/2019  Lower Venous Study Indications: SOB, and pulmonary embolism.  Risk Factors: Confirmed PE HTN, CAD, HLD, MI. Comparison Study: No prior study. Performing Technologist: Baldwin Crown ARDMS, RVT  Examination Guidelines: A complete evaluation includes  B-mode imaging, spectral Doppler, color Doppler, and power Doppler as needed of all accessible portions of each vessel. Bilateral testing is considered an integral part of a complete examination. Limited examinations for reoccurring indications may be performed as noted.  +---------+---------------+---------+-----------+----------+--------------+ RIGHT    CompressibilityPhasicitySpontaneityPropertiesThrombus Aging +---------+---------------+---------+-----------+----------+--------------+ CFV      Full           Yes      Yes                                 +---------+---------------+---------+-----------+----------+--------------+ SFJ      Full                                                        +---------+---------------+---------+-----------+----------+--------------+ FV Prox  Full                                                        +---------+---------------+---------+-----------+----------+--------------+ FV Mid   Full                                                        +---------+---------------+---------+-----------+----------+--------------+ FV DistalFull                                                        +---------+---------------+---------+-----------+----------+--------------+ PFV      Full                                                        +---------+---------------+---------+-----------+----------+--------------+ POP      Full           Yes      Yes                                 +---------+---------------+---------+-----------+----------+--------------+ PTV      Full                                                        +---------+---------------+---------+-----------+----------+--------------+  PERO     Full                                                        +---------+---------------+---------+-----------+----------+--------------+   +---------+---------------+---------+-----------+----------+--------------+  LEFT     CompressibilityPhasicitySpontaneityPropertiesThrombus Aging +---------+---------------+---------+-----------+----------+--------------+ CFV      Full           Yes      Yes                                 +---------+---------------+---------+-----------+----------+--------------+ SFJ      Full                                                        +---------+---------------+---------+-----------+----------+--------------+ FV Prox  Full                                                        +---------+---------------+---------+-----------+----------+--------------+ FV Mid   Full                                                        +---------+---------------+---------+-----------+----------+--------------+ FV DistalFull                                                        +---------+---------------+---------+-----------+----------+--------------+ PFV      Full                                                        +---------+---------------+---------+-----------+----------+--------------+ POP      Full           Yes      Yes                                 +---------+---------------+---------+-----------+----------+--------------+ PTV      None                                         Acute          +---------+---------------+---------+-----------+----------+--------------+ PERO     Full                                                        +---------+---------------+---------+-----------+----------+--------------+  One PTV non compressible and no blood flow    Summary: Right: There is no evidence of deep vein thrombosis in the lower extremity. No cystic structure found in the popliteal fossa. Left: Findings consistent with acute deep vein thrombosis involving one left posterior tibial veins. No cystic structure found in the popliteal fossa.  *See table(s) above for measurements and observations. Electronically signed by Gretta Began MD on  11/25/2019 at 3:02:59 PM.    Final       Subjective: "I'm feeling pretty good, doc."  Discharge Exam: Vitals:   11/26/19 1623 11/26/19 2342  BP: 98/66 129/81  Pulse: 100 (!) 108  Resp: 16 19  Temp: 98.6 F (37 C) 98.4 F (36.9 C)  SpO2: 91% 93%   Vitals:   11/26/19 0500 11/26/19 0810 11/26/19 1623 11/26/19 2342  BP: 133/84 123/89 98/66 129/81  Pulse: (!) 103 97 100 (!) 108  Resp: 16 16 16 19   Temp: 98.8 F (37.1 C) 98.8 F (37.1 C) 98.6 F (37 C) 98.4 F (36.9 C)  TempSrc: Oral Oral Oral Oral  SpO2: 100% 98% 91% 93%  Weight:      Height:        General: 64 y.o. male resting in bed in NAD Cardiovascular: RRR, +S1, S2, no m/g/r, equal pulses throughout Respiratory: CTABL, no w/r/r, normal WOB GI: BS+, NDNT, no masses noted, no organomegaly noted MSK: No e/c/c Neuro: A&O x 3, no focal deficits Psyc: Appropriate interaction and affect, calm/cooperative   The results of significant diagnostics from this hospitalization (including imaging, microbiology, ancillary and laboratory) are listed below for reference.     Microbiology: Recent Results (from the past 240 hour(s))  Culture, blood (routine x 2)     Status: None (Preliminary result)   Collection Time: 11/24/19  1:40 PM   Specimen: BLOOD RIGHT HAND  Result Value Ref Range Status   Specimen Description BLOOD RIGHT HAND  Final   Special Requests   Final    BOTTLES DRAWN AEROBIC AND ANAEROBIC Blood Culture results may not be optimal due to an inadequate volume of blood received in culture bottles   Culture   Final    NO GROWTH 2 DAYS Performed at [facility], [address], [place], [place] [address]    Report Status PENDING  Incomplete  Culture, blood (routine x 2)     Status: None (Preliminary result)   Collection Time: 11/24/19  1:41 PM   Specimen: BLOOD  Result Value Ref Range Status   Specimen Description BLOOD LEFT ANTECUBITAL  Final   Special Requests   Final    BOTTLES DRAWN AEROBIC AND  ANAEROBIC Blood Culture results may not be optimal due to an inadequate volume of blood received in culture bottles   Culture   Final    NO GROWTH 2 DAYS Performed at [facility], [address], [place], [place] [address]    Report Status PENDING  Incomplete  SARS CORONAVIRUS 2 (TAT 6-24 HRS) Nasopharyngeal Nasopharyngeal Swab     Status: None   Collection Time: 11/24/19  2:22 PM   Specimen: Nasopharyngeal Swab  Result Value Ref Range Status   SARS Coronavirus 2 NEGATIVE NEGATIVE Final    Comment: (NOTE) SARS-CoV-2 target nucleic acids are NOT DETECTED. The SARS-CoV-2 RNA is generally detectable in upper and lower respiratory specimens during the acute phase of infection. Negative results do not preclude SARS-CoV-2 infection, do not rule out co-infections with other pathogens, and should not be used  as the sole basis for treatment or other patient management decisions. Negative results must be combined with clinical observations, patient history, and epidemiological information. The expected result is Negative. Fact Sheet for Patients: HairSlick.no Fact Sheet for Healthcare Providers: quierodirigir.com This test is not yet approved or cleared by the [place] FDA and  has been authorized for detection and/or diagnosis of SARS-CoV-2 by FDA under an Emergency Use Authorization (EUA). This EUA will remain  in effect (meaning this test can be used) for the duration of the COVID-19 declaration under Section 56 4(b)(1) of the Act, 21 U.S.C. section 360bbb-3(b)(1), unless the authorization is terminated or revoked sooner. Performed at [facility], [address], [place], [place] [address]      Labs: BNP (last 3 results) No results for input(s): BNP in the last 8760 hours. Basic Metabolic Panel: Recent Labs  Lab 11/24/19 1314 11/24/19 1800 11/25/19 0620 11/26/19 0223 11/27/19 0220  NA 136  --  136 140 138   K 4.0  --  4.4 4.2 3.6  CL 103  --  104 108 107  CO2 21*  --  24 25 23   GLUCOSE 103*  --  84 77 102*  BUN 14  --  15 13 12   CREATININE 1.18  --  1.06 1.01 0.97  CALCIUM 12.2* 11.3* 11.1* 10.7* 10.7*  MG  --   --   --  2.3 2.2  PHOS  --   --   --  2.3* 2.3*   Liver Function Tests: Recent Labs  Lab 11/26/19 0223 11/27/19 0220  ALBUMIN 2.4* 2.3*   No results for input(s): LIPASE, AMYLASE in the last 168 hours. No results for input(s): AMMONIA in the last 168 hours. CBC: Recent Labs  Lab 11/24/19 1314 11/25/19 0620 11/26/19 0223 11/27/19 0220  WBC 16.5* 12.2* 10.6* 9.2  NEUTROABS 13.3*  --   --  6.2  HGB 15.8 12.0* 12.4* 11.7*  HCT 47.8 36.8* 38.2* 35.5*  MCV 88.0 87.6 87.6 87.7  PLT 281 222 245 322   Cardiac Enzymes: No results for input(s): CKTOTAL, CKMB, CKMBINDEX, TROPONINI in the last 168 hours. BNP: Invalid input(s): POCBNP CBG: No results for input(s): GLUCAP in the last 168 hours. D-Dimer No results for input(s): DDIMER in the last 72 hours. Hgb A1c No results for input(s): HGBA1C in the last 72 hours. Lipid Profile No results for input(s): CHOL, HDL, LDLCALC, TRIG, CHOLHDL, LDLDIRECT in the last 72 hours. Thyroid function studies No results for input(s): TSH, T4TOTAL, T3FREE, THYROIDAB in the last 72 hours.  Invalid input(s): FREET3 Anemia work up No results for input(s): VITAMINB12, FOLATE, FERRITIN, TIBC, IRON, RETICCTPCT in the last 72 hours. Urinalysis No results found for: COLORURINE, APPEARANCEUR, LABSPEC, PHURINE, GLUCOSEU, HGBUR, BILIRUBINUR, KETONESUR, PROTEINUR, UROBILINOGEN, NITRITE, LEUKOCYTESUR Sepsis Labs Invalid input(s): PROCALCITONIN,  WBC,  LACTICIDVEN Microbiology Recent Results (from the past 240 hour(s))  Culture, blood (routine x 2)     Status: None (Preliminary result)   Collection Time: 11/24/19  1:40 PM   Specimen: BLOOD RIGHT HAND  Result Value Ref Range Status   Specimen Description BLOOD RIGHT HAND  Final   Special  Requests   Final    BOTTLES DRAWN AEROBIC AND ANAEROBIC Blood Culture results may not be optimal due to an inadequate volume of blood received in culture bottles   Culture   Final    NO GROWTH 2 DAYS Performed at [facility], [address], [place], [place] [address]  Report Status PENDING  Incomplete  Culture, blood (routine x 2)     Status: None (Preliminary result)   Collection Time: 11/24/19  1:41 PM   Specimen: BLOOD  Result Value Ref Range Status   Specimen Description BLOOD LEFT ANTECUBITAL  Final   Special Requests   Final    BOTTLES DRAWN AEROBIC AND ANAEROBIC Blood Culture results may not be optimal due to an inadequate volume of blood received in culture bottles   Culture   Final    NO GROWTH 2 DAYS Performed at [facility], [address] [place], [place] [address]    Report Status PENDING  Incomplete  SARS CORONAVIRUS 2 (TAT 6-24 HRS) Nasopharyngeal Nasopharyngeal Swab     Status: None   Collection Time: 11/24/19  2:22 PM   Specimen: Nasopharyngeal Swab  Result Value Ref Range Status   SARS Coronavirus 2 NEGATIVE NEGATIVE Final    Comment: (NOTE) SARS-CoV-2 target nucleic acids are NOT DETECTED. The SARS-CoV-2 RNA is generally detectable in upper and lower respiratory specimens during the acute phase of infection. Negative results do not preclude SARS-CoV-2 infection, do not rule out co-infections with other pathogens, and should not be used as the sole basis for treatment or other patient management decisions. Negative results must be combined with clinical observations, patient history, and epidemiological information. The expected result is Negative. Fact Sheet for Patients: HairSlick.nohttps://www.fda.gov/media/138098/download Fact Sheet for Healthcare Providers: quierodirigir.comhttps://www.fda.gov/media/138095/download This test is not yet approved or cleared by the [place] FDA and  has been authorized for detection and/or diagnosis of SARS-CoV-2 by FDA under an  Emergency Use Authorization (EUA). This EUA will remain  in effect (meaning this test can be used) for the duration of the COVID-19 declaration under Section 56 4(b)(1) of the Act, 21 U.S.C. section 360bbb-3(b)(1), unless the authorization is terminated or revoked sooner. Performed at [facility], [address] [place], [place] [address]      Time coordinating discharge: 35 minutes  SIGNED:   Teddy Spikeyrone A Cristopher Ciccarelli, DO  [facility] 11/27/2019, 7:17 AM   If 7PM-7AM, please contact night-coverage [email or web address]

## 2019-11-29 LAB — CULTURE, BLOOD (ROUTINE X 2)
Culture: NO GROWTH
Culture: NO GROWTH

## 2019-12-01 LAB — PTH-RELATED PEPTIDE: PTH-related peptide: <2 pmol/L

## 2019-12-02 ENCOUNTER — Telehealth: Payer: Self-pay | Admitting: Oncology

## 2019-12-02 NOTE — Telephone Encounter (Signed)
Scheduled appt per 1/22 sch message - pt to get an updated schedule next visit.

## 2019-12-07 ENCOUNTER — Other Ambulatory Visit (HOSPITAL_COMMUNITY): Payer: Self-pay | Admitting: General Surgery

## 2019-12-07 ENCOUNTER — Other Ambulatory Visit: Payer: Self-pay | Admitting: General Surgery

## 2019-12-29 ENCOUNTER — Telehealth: Payer: Self-pay | Admitting: Adult Health

## 2019-12-29 ENCOUNTER — Inpatient Hospital Stay: Payer: BLUE CROSS/BLUE SHIELD

## 2019-12-29 ENCOUNTER — Inpatient Hospital Stay: Payer: BLUE CROSS/BLUE SHIELD | Admitting: Adult Health

## 2019-12-29 NOTE — Telephone Encounter (Signed)
Called pt per 2/22 sch message- no answer - left message for pt to call back if reschedule is needed.

## 2020-01-26 NOTE — Progress Notes (Signed)
No show

## 2020-01-27 ENCOUNTER — Other Ambulatory Visit: Payer: Self-pay

## 2020-01-27 ENCOUNTER — Inpatient Hospital Stay: Payer: BLUE CROSS/BLUE SHIELD

## 2020-01-27 ENCOUNTER — Inpatient Hospital Stay: Payer: Self-pay | Attending: Oncology | Admitting: Oncology

## 2020-01-27 ENCOUNTER — Encounter (HOSPITAL_BASED_OUTPATIENT_CLINIC_OR_DEPARTMENT_OTHER): Payer: Self-pay | Admitting: Oncology

## 2020-01-27 DIAGNOSIS — I2782 Chronic pulmonary embolism: Secondary | ICD-10-CM

## 2020-01-27 DIAGNOSIS — I2119 ST elevation (STEMI) myocardial infarction involving other coronary artery of inferior wall: Secondary | ICD-10-CM

## 2020-01-27 DIAGNOSIS — K8689 Other specified diseases of pancreas: Secondary | ICD-10-CM

## 2020-01-27 DIAGNOSIS — E213 Hyperparathyroidism, unspecified: Secondary | ICD-10-CM

## 2020-01-27 DIAGNOSIS — I10 Essential (primary) hypertension: Secondary | ICD-10-CM

## 2020-03-07 ENCOUNTER — Encounter (HOSPITAL_COMMUNITY)
Admission: RE | Admit: 2020-03-07 | Discharge: 2020-03-07 | Disposition: A | Discharge status: Home / Self Care | Payer: 59 | Source: Ambulatory Visit | Attending: General Surgery | Admitting: General Surgery

## 2020-03-07 ENCOUNTER — Ambulatory Visit (HOSPITAL_COMMUNITY)
Admission: RE | Admit: 2020-03-07 | Discharge: 2020-03-07 | Disposition: A | Discharge status: Home / Self Care | Payer: 59 | Source: Ambulatory Visit | Attending: General Surgery | Admitting: General Surgery

## 2020-03-07 ENCOUNTER — Other Ambulatory Visit: Payer: Self-pay

## 2020-03-07 MED ORDER — TECHNETIUM TC 99M SESTAMIBI GENERIC - CARDIOLITE
24.4000 | Freq: Once | INTRAVENOUS | Status: AC | PRN
  Administered 2020-03-07: 24.4 via INTRAVENOUS

## 2020-03-22 ENCOUNTER — Ambulatory Visit: Payer: Self-pay | Admitting: Surgery

## 2020-03-29 ENCOUNTER — Encounter (HOSPITAL_COMMUNITY): Payer: Self-pay | Admitting: Surgery

## 2020-03-29 NOTE — H&P (Signed)
General Surgery [facility]  Vivianne Spence DOB: [birth year] Married / Language: English / Race: White Male   History of Present Illness   The patient is a 64 year old male who presents with primary hyperparathyroidism.  CHIEF COMPLAINT: primary hyperparathyroidism  Patient is referred by Dr. Marikay Alar Magrinat for surgical evaluation and management of newly diagnosed primary hyperparathyroidism. Patient's primary care physician is Dr. Tally Joe. Patient had an episode a few weeks ago where he developed lower bilateral chest pain. Evaluation found pulmonary emboli with a deep venous thrombosis in the lower extremity. Patient was admitted to the hospital and started on anticoagulants. He is now taking Eliquis. Patient also has a history of a cystic pancreatic mass. He had recent imaging. This showed slight enlargement of the mass compared to prior studies dating back to 2015. He was seen in consultation by my partner, Dr. Almond Lint. Laboratory studies during his hospitalization were also notable for hypercalcemia. Repeat testing on November 24, 2019 showed a calcium level of 11.3 and an intact PTH level of 122. Other than fatigue, the patient has been asymptomatic. He has had previous posterior cervical spine surgery. Studies were ordered to include nuclear medicine parathyroid scan and ultrasound examination. However, due to insurance concerns, the studies have not yet been performed. Patient is also not had a 24-hour urine collection for calcium. There is no family history of endocrine neoplasms. Patient presents today accompanied by his wife for evaluation and recommendations for management.   Allergies No Known Allergies   Medication History  Eliquis (5MG  Tablet, Oral) Active. Medications Reconciled  Vitals  Weight: 184.25 lb Height: 69in Body Surface Area: 1.99 m Body Mass Index: 27.21 kg/m  Temp.: 97.64F  Pulse: 113 (Regular)   BP: 138/60(Sitting, Left Arm, Standard)   Physical Exam  GENERAL APPEARANCE Development: normal Nutritional status: normal Gross deformities: none  SKIN Rash, lesions, ulcers: none Induration, erythema: none Nodules: none palpable  EYES Conjunctiva and lids: normal Pupils: equal and reactive Iris: normal bilaterally  EARS, NOSE, MOUTH, THROAT External ears: no lesion or deformity External nose: no lesion or deformity Hearing: grossly normal Patient is wearing a mask.  NECK Symmetric: yes Trachea: midline Thyroid: no palpable nodules in the thyroid bed  CHEST Respiratory effort: normal Retraction or accessory muscle use: no Breath sounds: normal bilaterally Rales, rhonchi, wheeze: none  CARDIOVASCULAR Auscultation: regular rhythm, normal rate Murmurs: none Pulses: carotid and radial pulse 2+ palpable Lower extremity edema: none Lower extremity varicosities: none  MUSCULOSKELETAL Station and gait: normal Digits and nails: no clubbing or cyanosis Muscle strength: grossly normal all extremities Range of motion: grossly normal all extremities Deformity: none  LYMPHATIC Cervical: none palpable Supraclavicular: none palpable  PSYCHIATRIC Oriented to person, place, and time: yes Mood and affect: normal for situation Judgment and insight: appropriate for situation    Assessment & Plan   HYPERCALCEMIA (E83.52) PRIMARY HYPERPARATHYROIDISM (E21.0)  Follow Up - Call [facility] office after tests / studies doneto discuss further plans  Patient presents today on referral by Dr. for evaluation of suspected primary hyperparathyroidism. Patient provided with a copy of "Parathyroid Surgery: Treatment for Your Parathyroid Gland Problem", published by [employer], 12 pages. Book reviewed and explained to patient during visit today.  Patient has biochemical evidence of primary hyperparathyroidism with an elevated calcium level of 11.3 and an elevated PTH level of  122. I have recommended proceeding with additional testing to include a 24-hour urine collection for calcium, a 25-hydroxy vitamin D level,  a nuclear medicine parathyroid scan, and an ultrasound examination of the neck. However, due to insurance concerns, the patient has not had the additional studies performed.  At this time, the patient needs to address his insurance concerns as he is limited to the [place]. Patient is considering changing insurance carriers so that he may be covered here in [place]. Also, the patient has only been on anticoagulation for pulmonary emboli for a couple of weeks. He will need to be on anticoagulation for several months before we would consider stopping his anticoagulation for any surgical intervention.  Patient is going to deal with the insurance concerns and then notify our office. Hopefully he will be able to have further diagnostic testing performed here in [place]. If not, we will forward records and assist him with receiving care at [place] Office: [phone]

## 2020-03-31 ENCOUNTER — Encounter (HOSPITAL_COMMUNITY)
Admission: RE | Admit: 2020-03-31 | Discharge: 2020-03-31 | Disposition: A | Discharge status: Home / Self Care | Payer: 59 | Source: Ambulatory Visit | Attending: Surgery | Admitting: Surgery

## 2020-03-31 ENCOUNTER — Encounter (HOSPITAL_COMMUNITY): Payer: Self-pay

## 2020-03-31 ENCOUNTER — Other Ambulatory Visit: Payer: Self-pay

## 2020-03-31 DIAGNOSIS — Z01812 Encounter for preprocedural laboratory examination: Secondary | ICD-10-CM | POA: Diagnosis not present

## 2020-03-31 HISTORY — DX: Angina pectoris, unspecified: I20.9

## 2020-03-31 NOTE — Patient Instructions (Addendum)
DUE TO COVID-19 ONLY ONE VISITOR IS ALLOWED TO COME WITH YOU AND STAY IN THE WAITING ROOM ONLY DURING PRE OP AND PROCEDURE DAY OF SURGERY. THE 1 VISITOR MAY VISIT WITH YOU AFTER SURGERY IN YOUR PRIVATE ROOM DURING VISITING HOURS ONLY!  YOU NEED TO HAVE A COVID 19 TEST ON__5/28_____ @__1 :30_____, THIS TEST MUST BE DONE BEFORE SURGERY, COME  [address] [place] , [address].  ([place]) ONCE YOUR COVID TEST IS COMPLETED, PLEASE BEGIN THE QUARANTINE INSTRUCTIONS AS OUTLINED IN YOUR HANDOUT.                Daniel Bonilla   Your procedure is scheduled on: 04/05/20   Report to [facility] Main  Entrance   Report to admitting at 10:00 AM     Call this number if you have problems the morning of surgery [phone]    Remember: Do not eat food or drink liquids :After Midnight.   BRUSH YOUR TEETH MORNING OF SURGERY AND RINSE YOUR MOUTH OUT, NO CHEWING GUM CANDY OR MINTS.     Take these medicines the morning of surgery with A SIP OF WATER: none              You may not have any metal on your body including              piercings  Do not wear jewelry,  lotions, powders or  deodorant                    Men may shave face and neck.   Do not bring valuables to the hospital. Daniel Bonilla.  Contacts, dentures or bridgework may not be worn into surgery.      Patients discharged the day of surgery will not be allowed to drive home.  IF YOU ARE HAVING SURGERY AND GOING HOME THE SAME DAY, YOU MUST HAVE AN ADULT TO DRIVE YOU HOME AND BE WITH YOU FOR 24 HOURS.  YOU MAY GO HOME BY TAXI OR UBER OR ORTHERWISE, BUT AN ADULT MUST ACCOMPANY YOU HOME AND STAY WITH YOU FOR 24 HOURS.  Name and phone number of your driver:  Special Instructions: N/A              Please read over the following fact sheets you were given: _____________________________________________________________________             [facility] - Preparing  for Surgery Before surgery, you can play an important role.  Because skin is not sterile, your skin needs to be as free of germs as possible.  You can reduce the number of germs on your skin by washing with CHG (chlorahexidine gluconate) soap before surgery.  CHG is an antiseptic cleaner which kills germs and bonds with the skin to continue killing germs even after washing. Please DO NOT use if you have an allergy to CHG or antibacterial soaps.  If your skin becomes reddened/irritated stop using the CHG and inform your nurse when you arrive at Short Stay. .  You may shave your face/neck. Please follow these instructions carefully:  1.  Shower with CHG Soap the night before surgery and the  morning of Surgery.  2.  If you choose to wash your hair, wash your hair first as usual with your  normal  shampoo.  3.  After you shampoo, rinse your hair  and body thoroughly to remove the  shampoo.                                        4.  Use CHG as you would any other liquid soap.  You can apply chg directly  to the skin and wash                       Gently with a scrungie or clean washcloth.  5.  Apply the CHG Soap to your body ONLY FROM THE NECK DOWN.   Do not use on face/ open                           Wound or open sores. Avoid contact with eyes, ears mouth and genitals (private parts).                       Wash face,  Genitals (private parts) with your normal soap.             6.  Wash thoroughly, paying special attention to the area where your surgery  will be performed.  7.  Thoroughly rinse your body with warm water from the neck down.  8.  DO NOT shower/wash with your normal soap after using and rinsing off  the CHG Soap.                9.  Pat yourself dry with a clean towel.            10.  Wear clean pajamas.            11.  Place clean sheets on your bed the night of your first shower and do not  sleep with pets. Day of Surgery : Do not apply any lotions/deodorants the morning of surgery.   Please wear clean clothes to the hospital/surgery center.  FAILURE TO FOLLOW THESE INSTRUCTIONS MAY RESULT IN THE CANCELLATION OF YOUR SURGERY PATIENT SIGNATURE_________________________________  NURSE SIGNATURE__________________________________  ________________________________________________________________________

## 2020-03-31 NOTE — Progress Notes (Signed)
COVID Vaccine Completed:NO Date COVID Vaccine completed: COVID vaccine manufacturer: Pfizer    [facility] [employer]   PCP - Dr. Tally Joe Cardiologist - Dr. P. [place]  Chest x-ray - 11/24/19 EKG - 12/01/19 Stress Test - no ECHO -11/25/19 Cardiac Cath - 2013  Sleep Study - yes-neg CPAP - no  Fasting Blood Sugar - NA Checks Blood Sugar _____ times a day  Blood Thinner Instructions:Eliquis Aspirin Instructions:Dr. Swayne said to stop2 days prior to DOS Last Dose:04/01/20  Anesthesia review:   Patient denies shortness of breath, fever, cough and chest pain at PAT appointment yes  Patient verbalized understanding of instructions that were given to them at the PAT appointment. Patient was also instructed that they will need to review over the PAT instructions again at home before surgery. Yes

## 2020-04-01 ENCOUNTER — Other Ambulatory Visit (HOSPITAL_COMMUNITY)
Admission: RE | Admit: 2020-04-01 | Discharge: 2020-04-01 | Disposition: A | Discharge status: Home / Self Care | Payer: 59 | Source: Ambulatory Visit | Attending: Surgery | Admitting: Surgery

## 2020-04-01 ENCOUNTER — Encounter (HOSPITAL_COMMUNITY)
Admission: RE | Admit: 2020-04-01 | Discharge: 2020-04-01 | Disposition: A | Discharge status: Home / Self Care | Payer: 59 | Source: Ambulatory Visit | Attending: Surgery | Admitting: Surgery

## 2020-04-01 DIAGNOSIS — Z01812 Encounter for preprocedural laboratory examination: Secondary | ICD-10-CM | POA: Insufficient documentation

## 2020-04-01 DIAGNOSIS — Z20822 Contact with and (suspected) exposure to covid-19: Secondary | ICD-10-CM | POA: Insufficient documentation

## 2020-04-01 LAB — CBC
HCT: 46.9 % (ref 39.0–52.0)
Hemoglobin: 15.1 g/dL (ref 13.0–17.0)
MCH: 27.9 pg (ref 26.0–34.0)
MCHC: 32.2 g/dL (ref 30.0–36.0)
MCV: 86.5 fL (ref 80.0–100.0)
Platelets: 314 10*3/uL (ref 150–400)
RBC: 5.42 MIL/uL (ref 4.22–5.81)
RDW: 13.2 % (ref 11.5–15.5)
WBC: 9.6 10*3/uL (ref 4.0–10.5)
nRBC: 0 % (ref 0.0–0.2)

## 2020-04-02 LAB — SARS CORONAVIRUS 2 (TAT 6-24 HRS): SARS Coronavirus 2: NEGATIVE

## 2020-04-05 ENCOUNTER — Ambulatory Visit (HOSPITAL_COMMUNITY): Payer: 59 | Admitting: Certified Registered Nurse Anesthetist

## 2020-04-05 ENCOUNTER — Other Ambulatory Visit: Payer: Self-pay

## 2020-04-05 ENCOUNTER — Ambulatory Visit (HOSPITAL_COMMUNITY)
Admission: RE | Admit: 2020-04-05 | Discharge: 2020-04-05 | Disposition: A | Discharge status: Home / Self Care | Payer: 59 | Attending: Surgery | Admitting: Surgery

## 2020-04-05 ENCOUNTER — Encounter (HOSPITAL_COMMUNITY)
Admission: RE | Disposition: A | Discharge status: Home / Self Care | Payer: Self-pay | Source: Home / Self Care | Attending: Surgery

## 2020-04-05 ENCOUNTER — Encounter (HOSPITAL_COMMUNITY): Payer: Self-pay | Admitting: Surgery

## 2020-04-05 DIAGNOSIS — E210 Primary hyperparathyroidism: Secondary | ICD-10-CM | POA: Diagnosis not present

## 2020-04-05 DIAGNOSIS — Z87891 Personal history of nicotine dependence: Secondary | ICD-10-CM | POA: Insufficient documentation

## 2020-04-05 DIAGNOSIS — I2510 Atherosclerotic heart disease of native coronary artery without angina pectoris: Secondary | ICD-10-CM | POA: Insufficient documentation

## 2020-04-05 DIAGNOSIS — I10 Essential (primary) hypertension: Secondary | ICD-10-CM | POA: Insufficient documentation

## 2020-04-05 DIAGNOSIS — Z86718 Personal history of other venous thrombosis and embolism: Secondary | ICD-10-CM | POA: Insufficient documentation

## 2020-04-05 DIAGNOSIS — I252 Old myocardial infarction: Secondary | ICD-10-CM | POA: Insufficient documentation

## 2020-04-05 DIAGNOSIS — Z86711 Personal history of pulmonary embolism: Secondary | ICD-10-CM | POA: Insufficient documentation

## 2020-04-05 HISTORY — PX: PARATHYROIDECTOMY: SHX19

## 2020-04-05 SURGERY — PARATHYROIDECTOMY
Anesthesia: General | Laterality: Right

## 2020-04-05 MED ORDER — ROCURONIUM BROMIDE 10 MG/ML (PF) SYRINGE
PREFILLED_SYRINGE | INTRAVENOUS | Status: AC
  Filled 2020-04-05: qty 10

## 2020-04-05 MED ORDER — ACETAMINOPHEN 10 MG/ML IV SOLN: 1000.0000 mg | Freq: Once | INTRAVENOUS | Status: DC | PRN

## 2020-04-05 MED ORDER — OXYCODONE HCL 5 MG PO TABS: 5.0000 mg | ORAL_TABLET | ORAL | 0 refills | Status: DC | PRN

## 2020-04-05 MED ORDER — DEXAMETHASONE SODIUM PHOSPHATE 10 MG/ML IJ SOLN
INTRAMUSCULAR | Status: DC | PRN
  Administered 2020-04-05: 5 mg via INTRAVENOUS

## 2020-04-05 MED ORDER — ONDANSETRON HCL 4 MG/2ML IJ SOLN
INTRAMUSCULAR | Status: AC
  Filled 2020-04-05: qty 2

## 2020-04-05 MED ORDER — CHLORHEXIDINE GLUCONATE 0.12 % MT SOLN
15.0000 mL | Freq: Once | OROMUCOSAL | Status: AC
  Administered 2020-04-05: 15 mL via OROMUCOSAL

## 2020-04-05 MED ORDER — ORAL CARE MOUTH RINSE: 15.0000 mL | Freq: Once | OROMUCOSAL | Status: AC

## 2020-04-05 MED ORDER — CEFAZOLIN SODIUM-DEXTROSE 2-4 GM/100ML-% IV SOLN
2.0000 g | INTRAVENOUS | Status: AC
  Administered 2020-04-05: 2 g via INTRAVENOUS
  Filled 2020-04-05: qty 100

## 2020-04-05 MED ORDER — MIDAZOLAM HCL 2 MG/2ML IJ SOLN
INTRAMUSCULAR | Status: DC | PRN
  Administered 2020-04-05: 2 mg via INTRAVENOUS

## 2020-04-05 MED ORDER — MIDAZOLAM HCL 2 MG/2ML IJ SOLN
INTRAMUSCULAR | Status: AC
  Filled 2020-04-05: qty 2

## 2020-04-05 MED ORDER — PROPOFOL 10 MG/ML IV BOLUS
INTRAVENOUS | Status: AC
  Filled 2020-04-05: qty 20

## 2020-04-05 MED ORDER — PROPOFOL 10 MG/ML IV BOLUS
INTRAVENOUS | Status: DC | PRN
  Administered 2020-04-05: 200 mg via INTRAVENOUS

## 2020-04-05 MED ORDER — SUGAMMADEX SODIUM 200 MG/2ML IV SOLN
INTRAVENOUS | Status: DC | PRN
  Administered 2020-04-05: 200 mg via INTRAVENOUS

## 2020-04-05 MED ORDER — 0.9 % SODIUM CHLORIDE (POUR BTL) OPTIME
TOPICAL | Status: DC | PRN
  Administered 2020-04-05: 1000 mL

## 2020-04-05 MED ORDER — HYDROMORPHONE HCL 1 MG/ML IJ SOLN
INTRAMUSCULAR | Status: AC
  Administered 2020-04-05: 0.5 mg via INTRAVENOUS
  Filled 2020-04-05: qty 1

## 2020-04-05 MED ORDER — BUPIVACAINE HCL 0.25 % IJ SOLN
INTRAMUSCULAR | Status: AC
  Filled 2020-04-05: qty 1

## 2020-04-05 MED ORDER — ROCURONIUM BROMIDE 10 MG/ML (PF) SYRINGE
PREFILLED_SYRINGE | INTRAVENOUS | Status: DC | PRN
  Administered 2020-04-05: 70 mg via INTRAVENOUS

## 2020-04-05 MED ORDER — DEXAMETHASONE SODIUM PHOSPHATE 10 MG/ML IJ SOLN
INTRAMUSCULAR | Status: AC
  Filled 2020-04-05: qty 1

## 2020-04-05 MED ORDER — PHENYLEPHRINE 40 MCG/ML (10ML) SYRINGE FOR IV PUSH (FOR BLOOD PRESSURE SUPPORT)
PREFILLED_SYRINGE | INTRAVENOUS | Status: DC | PRN
  Administered 2020-04-05 (×5): 80 ug via INTRAVENOUS

## 2020-04-05 MED ORDER — LACTATED RINGERS IV SOLN: INTRAVENOUS | Status: DC

## 2020-04-05 MED ORDER — LIDOCAINE 2% (20 MG/ML) 5 ML SYRINGE
INTRAMUSCULAR | Status: AC
  Filled 2020-04-05: qty 5

## 2020-04-05 MED ORDER — HYDROMORPHONE HCL 1 MG/ML IJ SOLN
0.2500 mg | INTRAMUSCULAR | Status: DC | PRN
  Administered 2020-04-05: 0.5 mg via INTRAVENOUS

## 2020-04-05 MED ORDER — BUPIVACAINE HCL 0.25 % IJ SOLN
INTRAMUSCULAR | Status: DC | PRN
  Administered 2020-04-05: 8 mL

## 2020-04-05 MED ORDER — CHLORHEXIDINE GLUCONATE CLOTH 2 % EX PADS: 6.0000 | MEDICATED_PAD | Freq: Once | CUTANEOUS | Status: DC

## 2020-04-05 MED ORDER — LIDOCAINE HCL (CARDIAC) PF 100 MG/5ML IV SOSY
PREFILLED_SYRINGE | INTRAVENOUS | Status: DC | PRN
  Administered 2020-04-05: 100 mg via INTRAVENOUS

## 2020-04-05 MED ORDER — ACETAMINOPHEN 10 MG/ML IV SOLN
INTRAVENOUS | Status: AC
  Administered 2020-04-05: 1000 mg via INTRAVENOUS
  Filled 2020-04-05: qty 100

## 2020-04-05 MED ORDER — ONDANSETRON HCL 4 MG/2ML IJ SOLN
INTRAMUSCULAR | Status: DC | PRN
  Administered 2020-04-05: 4 mg via INTRAVENOUS

## 2020-04-05 MED ORDER — FENTANYL CITRATE (PF) 250 MCG/5ML IJ SOLN
INTRAMUSCULAR | Status: DC | PRN
  Administered 2020-04-05 (×2): 25 ug via INTRAVENOUS
  Administered 2020-04-05: 100 ug via INTRAVENOUS

## 2020-04-05 MED ORDER — FENTANYL CITRATE (PF) 250 MCG/5ML IJ SOLN
INTRAMUSCULAR | Status: AC
  Filled 2020-04-05: qty 5

## 2020-04-05 SURGICAL SUPPLY — 32 items
ADH SKN CLS APL DERMABOND .7 (GAUZE/BANDAGES/DRESSINGS) ×1
APL PRP STRL LF DISP 70% ISPRP (MISCELLANEOUS) ×1
ATTRACTOMAT 16X20 MAGNETIC DRP (DRAPES) ×2 IMPLANT
BLADE SURG 15 STRL LF DISP TIS (BLADE) ×1 IMPLANT
BLADE SURG 15 STRL SS (BLADE) ×2
CHLORAPREP W/TINT 26 (MISCELLANEOUS) ×2 IMPLANT
CLIP VESOCCLUDE MED 6/CT (CLIP) ×4 IMPLANT
CLIP VESOCCLUDE SM WIDE 6/CT (CLIP) ×4 IMPLANT
COVER SURGICAL LIGHT HANDLE (MISCELLANEOUS) ×2 IMPLANT
COVER WAND RF STERILE (DRAPES) ×2 IMPLANT
DERMABOND ADVANCED (GAUZE/BANDAGES/DRESSINGS) ×1
DERMABOND ADVANCED .7 DNX12 (GAUZE/BANDAGES/DRESSINGS) ×1 IMPLANT
DRAPE LAPAROTOMY T 98X78 PEDS (DRAPES) ×2 IMPLANT
ELECT REM PT RETURN 15FT ADLT (MISCELLANEOUS) ×2 IMPLANT
GAUZE 4X4 16PLY RFD (DISPOSABLE) ×2 IMPLANT
GLOVE SURG ORTHO 8.0 STRL STRW (GLOVE) ×2 IMPLANT
GOWN STRL REUS W/TWL XL LVL3 (GOWN DISPOSABLE) ×6 IMPLANT
HEMOSTAT SURGICEL 2X4 FIBR (HEMOSTASIS) ×2 IMPLANT
ILLUMINATOR WAVEGUIDE N/F (MISCELLANEOUS) IMPLANT
KIT BASIN (CUSTOM PROCEDURE TRAY) ×2 IMPLANT
KIT TURNOVER KIT A (KITS) IMPLANT
NDL HYPO 25X1 1.5 SAFETY (NEEDLE) ×1 IMPLANT
NEEDLE HYPO 25X1 1.5 SAFETY (NEEDLE) ×2 IMPLANT
PACK BASIC VI WITH GOWN DISP (CUSTOM PROCEDURE TRAY) ×2 IMPLANT
PENCIL SMOKE EVACUATOR (MISCELLANEOUS) ×2 IMPLANT
SUT MNCRL AB 4-0 PS2 18 (SUTURE) ×2 IMPLANT
SUT VIC AB 3-0 SH 18 (SUTURE) ×2 IMPLANT
SYR BULB IRRIG 60ML STRL (SYRINGE) ×2 IMPLANT
SYR CONTROL 10ML LL (SYRINGE) ×2 IMPLANT
TOWEL OR 17X26 10 PK STRL BLUE (TOWEL DISPOSABLE) ×2 IMPLANT
TOWEL OR NON WOVEN STRL DISP B (DISPOSABLE) ×2 IMPLANT
TUBING CONNECTING 10 (TUBING) ×2 IMPLANT

## 2020-04-05 NOTE — Interval H&P Note (Signed)
History and Physical Interval Note:  04/05/2020 11:13 AM  Daniel Bonilla  has presented today for surgery, with the diagnosis of PRIMARY HYPERPARATHYROIDISM.  The various methods of treatment have been discussed with the patient and family. After consideration of risks, benefits and other options for treatment, the patient has consented to    Procedure(s): RIGHT INFERIOR PARATHYROIDECTOMY (Right) as a surgical intervention.    The patient's history has been reviewed, patient examined, no change in status, stable for surgery.  I have reviewed the patient's chart and labs.  Questions were answered to the patient's satisfaction.    Darnell Level, MD [facility] Office: [phone]   Darnell Level

## 2020-04-05 NOTE — Anesthesia Preprocedure Evaluation (Addendum)
Anesthesia Evaluation  Patient identified by MRN, date of birth, ID band Patient awake    Reviewed: Allergy & Precautions, NPO status , Patient's Chart, lab work & pertinent test results  Airway Mallampati: II  TM Distance: >3 FB Neck ROM: Full    Dental  (+) Chipped, Dental Advisory Given   Pulmonary neg pulmonary ROS, former smoker,    Pulmonary exam normal breath sounds clear to auscultation       Cardiovascular hypertension, + CAD, + Past MI and + Cardiac Stents  Normal cardiovascular exam Rhythm:Regular Rate:Normal     Neuro/Psych negative neurological ROS  negative psych ROS   GI/Hepatic negative GI ROS, Neg liver ROS,   Endo/Other  negative endocrine ROS  Renal/GU negative Renal ROS  negative genitourinary   Musculoskeletal negative musculoskeletal ROS (+)   Abdominal   Peds negative pediatric ROS (+)  Hematology negative hematology ROS (+)   Anesthesia Other Findings   Reproductive/Obstetrics negative OB ROS                            Anesthesia Physical Anesthesia Plan  ASA: III  Anesthesia Plan: General   Post-op Pain Management:    Induction: Intravenous  PONV Risk Score and Plan: 2 and Ondansetron, Dexamethasone and Treatment may vary due to age or medical condition  Airway Management Planned: Oral ETT  Additional Equipment:   Intra-op Plan:   Post-operative Plan: Extubation in OR  Informed Consent: I have reviewed the patients History and Physical, chart, labs and discussed the procedure including the risks, benefits and alternatives for the proposed anesthesia with the patient or authorized representative who has indicated his/her understanding and acceptance.     Dental advisory given  Plan Discussed with: CRNA and Surgeon  Anesthesia Plan Comments:         Anesthesia Quick Evaluation

## 2020-04-05 NOTE — Transfer of Care (Signed)
Immediate Anesthesia Transfer of Care Note  Patient: Daniel Bonilla  Procedure(s) Performed: RIGHT INFERIOR PARATHYROIDECTOMY (Right )  Patient Location: PACU  Anesthesia Type:General  Level of Consciousness: awake, alert , oriented and patient cooperative  Airway & Oxygen Therapy: Patient Spontanous Breathing and Patient connected to face mask oxygen  Post-op Assessment: Report given to RN and Post -op Vital signs reviewed and stable  Post vital signs: Reviewed and stable  Last Vitals:  Vitals Value Taken Time  BP 144/93 04/05/20 1239  Temp    Pulse 75 04/05/20 1241  Resp 11 04/05/20 1241  SpO2 98 % 04/05/20 1241  Vitals shown include unvalidated device data.  Last Pain:  Vitals:   04/05/20 1032  TempSrc:   PainSc: 0-No pain         Complications: No apparent anesthesia complications

## 2020-04-05 NOTE — Anesthesia Procedure Notes (Signed)
Procedure Name: Intubation Date/Time: 04/05/2020 11:44 AM Performed by: Raenette Rover, CRNA Pre-anesthesia Checklist: Patient identified, Emergency Drugs available, Suction available and Patient being monitored Patient Re-evaluated:Patient Re-evaluated prior to induction Oxygen Delivery Method: Circle system utilized Preoxygenation: Pre-oxygenation with 100% oxygen Induction Type: IV induction Ventilation: Mask ventilation without difficulty Laryngoscope Size: Mac and 3 Grade View: Grade I Tube type: Oral Tube size: 7.5 mm Number of attempts: 1 Airway Equipment and Method: Stylet Placement Confirmation: ETT inserted through vocal cords under direct vision,  positive ETCO2 and breath sounds checked- equal and bilateral Secured at: 21 cm Tube secured with: Tape Dental Injury: Teeth and Oropharynx as per pre-operative assessment

## 2020-04-05 NOTE — Anesthesia Postprocedure Evaluation (Signed)
Anesthesia Post Note  Patient: Daniel Bonilla  Procedure(s) Performed: RIGHT INFERIOR PARATHYROIDECTOMY (Right )     Patient location during evaluation: PACU Anesthesia Type: General Level of consciousness: awake and alert Pain management: pain level controlled Vital Signs Assessment: post-procedure vital signs reviewed and stable Respiratory status: spontaneous breathing, nonlabored ventilation, respiratory function stable and patient connected to nasal cannula oxygen Cardiovascular status: blood pressure returned to baseline and stable Postop Assessment: no apparent nausea or vomiting Anesthetic complications: no    Last Vitals:  Vitals:   04/05/20 1330 04/05/20 1345  BP: (!) 128/93   Pulse: 77 75  Resp: (!) 23 12  Temp:    SpO2: 91% 95%    Last Pain:  Vitals:   04/05/20 1032  TempSrc:   PainSc: 0-No pain                 Jasie Meleski S

## 2020-04-05 NOTE — Op Note (Signed)
OPERATIVE REPORT - PARATHYROIDECTOMY  Preoperative diagnosis: Primary hyperparathyroidism  Postop diagnosis: Same  Procedure: Right minimally invasive parathyroidectomy  Surgeon:  Darnell Level, MD  Anesthesia: General endotracheal  Estimated blood loss: Minimal  Preparation: ChloraPrep  Indications: Patient is referred by Dr. Marikay Alar Magrinat for surgical evaluation and management of newly diagnosed primary hyperparathyroidism. Patient's primary care physician is Dr. Tally Joe. Patient had an episode a few weeks ago where he developed lower bilateral chest pain. Evaluation found pulmonary emboli with a deep venous thrombosis in the lower extremity. Patient was admitted to the hospital and started on anticoagulants. He is now taking Eliquis. Patient also has a history of a cystic pancreatic mass. He had recent imaging. This showed slight enlargement of the mass compared to prior studies dating back to 2015. He was seen in consultation by my partner, Dr. Almond Lint. Laboratory studies during his hospitalization were also notable for hypercalcemia. Repeat testing on November 24, 2019 showed a calcium level of 11.3 and an intact PTH level of 122. Other than fatigue, the patient has been asymptomatic. He has had previous posterior cervical spine surgery. Studies were ordered to include nuclear medicine parathyroid scan and ultrasound examination. USN and sestamibi both localized a right side adenoma.  Patient now comes to surgery for parathyroidectomy.  Procedure: The patient was prepared in the pre-operative holding area. The patient was brought to the operating room and placed in a supine position on the operating room table. Following administration of general anesthesia, the patient was positioned and then prepped and draped in the usual strict aseptic fashion. After ascertaining that an adequate level of anesthesia been achieved, a neck incision was made with a #15 blade. Dissection  was carried through subcutaneous tissues and platysma. Hemostasis was obtained with the electrocautery. Skin flaps were developed circumferentially and a Weitlander retractor was placed for exposure.  Strap muscles were incised in the midline. Strap muscles were reflected lateralley exposing the thyroid lobe. With gentle blunt dissection the thyroid lobe was mobilized.  Dissection was carried through adipose tissue and an enlarged parathyroid gland was identified. It was gently mobilized. Vascular structures were divided between small ligaclips. Care was taken to avoid the recurrent laryngeal nerve and the esophagus. The parathyroid gland was completely excised. It was submitted to pathology where frozen section confirmed parathyroid tissue consistent with adenoma.  Neck was irrigated with warm saline and good hemostasis was noted. Fibrillar was placed in the operative field. Strap muscles were approximated in the midline with interrupted 3-0 Vicryl sutures. Platysma was closed with interrupted 3-0 Vicryl sutures. Marcaine was infiltrated circumferentially. Skin was closed with a running 4-0 Monocryl subcuticular suture. Wound was washed and dried and Dermabond was applied. Patient was awakened from anesthesia and brought to the recovery room. The patient tolerated the procedure well.   Darnell Level, MD [facility] Office: [phone]

## 2020-04-06 LAB — SURGICAL PATHOLOGY

## 2020-05-02 ENCOUNTER — Telehealth: Payer: Self-pay | Admitting: Oncology

## 2020-05-02 NOTE — Telephone Encounter (Signed)
Scheduled appt per phone conversation with Val. Pt's spouse confirmed appt date and time.
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
## 2020-06-07 ENCOUNTER — Inpatient Hospital Stay: Payer: 59 | Attending: Oncology | Admitting: Oncology

## 2020-06-07 ENCOUNTER — Encounter: Payer: Self-pay | Admitting: Oncology

## 2020-06-07 ENCOUNTER — Inpatient Hospital Stay: Payer: 59

## 2020-06-07 ENCOUNTER — Other Ambulatory Visit: Payer: Self-pay

## 2020-06-07 VITALS — BP 143/98 | HR 87 | Temp 98.9°F | Resp 18 | Ht 69.5 in | Wt 201.9 lb

## 2020-06-07 DIAGNOSIS — I2609 Other pulmonary embolism with acute cor pulmonale: Secondary | ICD-10-CM | POA: Diagnosis not present

## 2020-06-07 DIAGNOSIS — I2510 Atherosclerotic heart disease of native coronary artery without angina pectoris: Secondary | ICD-10-CM | POA: Insufficient documentation

## 2020-06-07 DIAGNOSIS — I10 Essential (primary) hypertension: Secondary | ICD-10-CM | POA: Insufficient documentation

## 2020-06-07 DIAGNOSIS — I252 Old myocardial infarction: Secondary | ICD-10-CM | POA: Insufficient documentation

## 2020-06-07 DIAGNOSIS — E785 Hyperlipidemia, unspecified: Secondary | ICD-10-CM | POA: Insufficient documentation

## 2020-06-07 DIAGNOSIS — K835 Biliary cyst: Secondary | ICD-10-CM

## 2020-06-07 DIAGNOSIS — Z7901 Long term (current) use of anticoagulants: Secondary | ICD-10-CM | POA: Insufficient documentation

## 2020-06-07 DIAGNOSIS — Z87891 Personal history of nicotine dependence: Secondary | ICD-10-CM | POA: Insufficient documentation

## 2020-06-07 DIAGNOSIS — Z79899 Other long term (current) drug therapy: Secondary | ICD-10-CM | POA: Insufficient documentation

## 2020-06-07 DIAGNOSIS — I2782 Chronic pulmonary embolism: Secondary | ICD-10-CM

## 2020-06-07 DIAGNOSIS — Z86711 Personal history of pulmonary embolism: Secondary | ICD-10-CM | POA: Insufficient documentation

## 2020-06-07 DIAGNOSIS — Z85828 Personal history of other malignant neoplasm of skin: Secondary | ICD-10-CM | POA: Insufficient documentation

## 2020-06-07 DIAGNOSIS — I2602 Saddle embolus of pulmonary artery with acute cor pulmonale: Secondary | ICD-10-CM

## 2020-06-07 LAB — COMPREHENSIVE METABOLIC PANEL
ALT: 17 U/L (ref 0–44)
AST: 19 U/L (ref 15–41)
Albumin: 3.9 g/dL (ref 3.5–5.0)
Alkaline Phosphatase: 151 U/L — ABNORMAL HIGH (ref 38–126)
Anion gap: 6 (ref 5–15)
BUN: 11 mg/dL (ref 8–23)
CO2: 24 mmol/L (ref 22–32)
Calcium: 9.3 mg/dL (ref 8.9–10.3)
Chloride: 110 mmol/L (ref 98–111)
Creatinine, Ser: 1.18 mg/dL (ref 0.61–1.24)
GFR calc Af Amer: >60 mL/min (ref >60)
GFR calc non Af Amer: >60 mL/min (ref >60)
Glucose, Bld: 89 mg/dL (ref 70–99)
Potassium: 4.6 mmol/L (ref 3.5–5.1)
Sodium: 140 mmol/L (ref 135–145)
Total Bilirubin: 0.8 mg/dL (ref 0.3–1.2)
Total Protein: 7.2 g/dL (ref 6.5–8.1)

## 2020-06-07 LAB — CBC WITH DIFFERENTIAL/PLATELET
Abs Immature Granulocytes: 0.04 10*3/uL (ref 0.00–0.07)
Basophils Absolute: 0.1 10*3/uL (ref 0.0–0.1)
Basophils Relative: 2 %
Eosinophils Absolute: 0.4 10*3/uL (ref 0.0–0.5)
Eosinophils Relative: 6 %
HCT: 44.5 % (ref 39.0–52.0)
Hemoglobin: 14.7 g/dL (ref 13.0–17.0)
Immature Granulocytes: 1 %
Lymphocytes Relative: 25 %
Lymphs Abs: 1.7 10*3/uL (ref 0.7–4.0)
MCH: 27.9 pg (ref 26.0–34.0)
MCHC: 33 g/dL (ref 30.0–36.0)
MCV: 84.4 fL (ref 80.0–100.0)
Monocytes Absolute: 0.8 10*3/uL (ref 0.1–1.0)
Monocytes Relative: 12 %
Neutro Abs: 3.8 10*3/uL (ref 1.7–7.7)
Neutrophils Relative %: 54 %
Platelets: 264 10*3/uL (ref 150–400)
RBC: 5.27 MIL/uL (ref 4.22–5.81)
RDW: 13.7 % (ref 11.5–15.5)
WBC: 6.8 10*3/uL (ref 4.0–10.5)
nRBC: 0 % (ref 0.0–0.2)

## 2020-06-07 MED ORDER — RIVAROXABAN 20 MG PO TABS
20.0000 mg | ORAL_TABLET | Freq: Every day | ORAL | 12 refills | Status: DC
Start: 2020-06-07 — End: 2020-11-21

## 2020-06-10 ENCOUNTER — Telehealth: Payer: Self-pay | Admitting: Oncology

## 2020-06-10 NOTE — Telephone Encounter (Signed)
Scheduled appts per 8/3 los. Left voicemail with appt date and time. Sent referral to [facility].

## 2020-09-27 DIAGNOSIS — L819 Disorder of pigmentation, unspecified: Secondary | ICD-10-CM | POA: Diagnosis not present

## 2020-09-27 DIAGNOSIS — L578 Other skin changes due to chronic exposure to nonionizing radiation: Secondary | ICD-10-CM | POA: Diagnosis not present

## 2020-09-27 DIAGNOSIS — L814 Other melanin hyperpigmentation: Secondary | ICD-10-CM | POA: Diagnosis not present

## 2020-09-27 DIAGNOSIS — D1801 Hemangioma of skin and subcutaneous tissue: Secondary | ICD-10-CM | POA: Diagnosis not present

## 2020-10-10 ENCOUNTER — Inpatient Hospital Stay: Payer: Medicare Other | Attending: Oncology | Admitting: Oncology

## 2020-10-10 ENCOUNTER — Inpatient Hospital Stay: Payer: Medicare Other

## 2020-10-18 ENCOUNTER — Other Ambulatory Visit: Payer: Self-pay | Admitting: Oncology

## 2020-10-24 ENCOUNTER — Ambulatory Visit
Admission: RE | Admit: 2020-10-24 | Discharge: 2020-10-24 | Disposition: A | Discharge status: Home / Self Care | Payer: Medicare Other | Source: Ambulatory Visit | Attending: Oncology | Admitting: Oncology

## 2020-10-24 ENCOUNTER — Other Ambulatory Visit: Payer: Self-pay

## 2020-10-24 ENCOUNTER — Other Ambulatory Visit: Payer: Self-pay | Admitting: Oncology

## 2020-10-24 DIAGNOSIS — K835 Biliary cyst: Secondary | ICD-10-CM

## 2020-10-24 DIAGNOSIS — K862 Cyst of pancreas: Secondary | ICD-10-CM | POA: Diagnosis not present

## 2020-10-24 MED ORDER — GADOBENATE DIMEGLUMINE 529 MG/ML IV SOLN
19.0000 mL | Freq: Once | INTRAVENOUS | Status: AC | PRN
  Administered 2020-10-24: 19 mL via INTRAVENOUS

## 2020-10-25 ENCOUNTER — Telehealth: Payer: Self-pay | Admitting: Oncology

## 2020-10-25 NOTE — Telephone Encounter (Signed)
Scheduled appt per 12/20 sch msg - pt is aware of appt date and time   

## 2020-11-10 DIAGNOSIS — Z1152 Encounter for screening for COVID-19: Secondary | ICD-10-CM | POA: Diagnosis not present

## 2020-11-15 DIAGNOSIS — K648 Other hemorrhoids: Secondary | ICD-10-CM | POA: Diagnosis not present

## 2020-11-15 DIAGNOSIS — D124 Benign neoplasm of descending colon: Secondary | ICD-10-CM | POA: Diagnosis not present

## 2020-11-15 DIAGNOSIS — D125 Benign neoplasm of sigmoid colon: Secondary | ICD-10-CM | POA: Diagnosis not present

## 2020-11-15 DIAGNOSIS — Z86010 Personal history of colonic polyps: Secondary | ICD-10-CM | POA: Diagnosis not present

## 2020-11-18 ENCOUNTER — Telehealth: Payer: Self-pay | Admitting: Adult Health

## 2020-11-18 NOTE — Telephone Encounter (Signed)
Changed appt to virtual visit with GM. Left voicemail with appt change information. Advised pt to call with any question or to reschedule if needed.
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
## 2020-11-21 ENCOUNTER — Inpatient Hospital Stay: Payer: Medicare Other | Attending: Oncology | Admitting: Oncology

## 2020-11-21 DIAGNOSIS — Z79899 Other long term (current) drug therapy: Secondary | ICD-10-CM | POA: Diagnosis not present

## 2020-11-21 DIAGNOSIS — Z7901 Long term (current) use of anticoagulants: Secondary | ICD-10-CM | POA: Insufficient documentation

## 2020-11-21 DIAGNOSIS — I252 Old myocardial infarction: Secondary | ICD-10-CM | POA: Insufficient documentation

## 2020-11-21 DIAGNOSIS — Z85828 Personal history of other malignant neoplasm of skin: Secondary | ICD-10-CM | POA: Insufficient documentation

## 2020-11-21 DIAGNOSIS — Z86711 Personal history of pulmonary embolism: Secondary | ICD-10-CM | POA: Insufficient documentation

## 2020-11-21 DIAGNOSIS — E785 Hyperlipidemia, unspecified: Secondary | ICD-10-CM | POA: Insufficient documentation

## 2020-11-21 DIAGNOSIS — I2782 Chronic pulmonary embolism: Secondary | ICD-10-CM | POA: Diagnosis not present

## 2020-11-21 DIAGNOSIS — I10 Essential (primary) hypertension: Secondary | ICD-10-CM | POA: Insufficient documentation

## 2020-11-21 DIAGNOSIS — K862 Cyst of pancreas: Secondary | ICD-10-CM | POA: Diagnosis not present

## 2020-11-21 DIAGNOSIS — I2510 Atherosclerotic heart disease of native coronary artery without angina pectoris: Secondary | ICD-10-CM | POA: Insufficient documentation

## 2020-11-21 DIAGNOSIS — Z87891 Personal history of nicotine dependence: Secondary | ICD-10-CM | POA: Insufficient documentation

## 2020-11-21 MED ORDER — RIVAROXABAN 20 MG PO TABS: 20.0000 mg | ORAL_TABLET | Freq: Every day | ORAL | 12 refills | Status: DC

## 2020-11-23 ENCOUNTER — Telehealth: Payer: Self-pay | Admitting: Oncology

## 2020-11-23 NOTE — Telephone Encounter (Signed)
No 1/17 los. No changes made to pt's schedule.  °

## 2020-12-12 DIAGNOSIS — K862 Cyst of pancreas: Secondary | ICD-10-CM | POA: Diagnosis not present

## 2021-05-22 DIAGNOSIS — J209 Acute bronchitis, unspecified: Secondary | ICD-10-CM | POA: Diagnosis not present

## 2021-09-25 ENCOUNTER — Other Ambulatory Visit: Payer: Self-pay | Admitting: Oncology

## 2021-09-25 ENCOUNTER — Other Ambulatory Visit: Payer: Self-pay | Admitting: General Surgery

## 2021-09-25 DIAGNOSIS — K862 Cyst of pancreas: Secondary | ICD-10-CM

## 2021-10-31 ENCOUNTER — Ambulatory Visit (HOSPITAL_COMMUNITY)
Admission: RE | Admit: 2021-10-31 | Discharge: 2021-10-31 | Disposition: A | Discharge status: Home / Self Care | Payer: Medicare Other | Source: Ambulatory Visit | Attending: Oncology | Admitting: Oncology

## 2021-10-31 ENCOUNTER — Other Ambulatory Visit: Payer: Self-pay

## 2021-10-31 ENCOUNTER — Other Ambulatory Visit: Payer: Self-pay | Admitting: Oncology

## 2021-10-31 DIAGNOSIS — K862 Cyst of pancreas: Secondary | ICD-10-CM

## 2021-10-31 MED ORDER — GADOBUTROL 1 MMOL/ML IV SOLN
9.0000 mL | Freq: Once | INTRAVENOUS | Status: AC | PRN
  Administered 2021-10-31: 13:00:00 9 mL via INTRAVENOUS

## 2021-11-10 DIAGNOSIS — M67819 Other specified disorders of synovium and tendon, unspecified shoulder: Secondary | ICD-10-CM | POA: Diagnosis not present

## 2021-11-10 DIAGNOSIS — M25511 Pain in right shoulder: Secondary | ICD-10-CM | POA: Diagnosis not present

## 2021-11-10 DIAGNOSIS — M25562 Pain in left knee: Secondary | ICD-10-CM | POA: Diagnosis not present

## 2021-11-10 DIAGNOSIS — M67811 Other specified disorders of synovium, right shoulder: Secondary | ICD-10-CM | POA: Diagnosis not present

## 2021-11-10 DIAGNOSIS — M7541 Impingement syndrome of right shoulder: Secondary | ICD-10-CM | POA: Diagnosis not present

## 2021-12-11 DIAGNOSIS — I2699 Other pulmonary embolism without acute cor pulmonale: Secondary | ICD-10-CM | POA: Diagnosis not present

## 2021-12-11 DIAGNOSIS — K862 Cyst of pancreas: Secondary | ICD-10-CM | POA: Diagnosis not present

## 2021-12-22 ENCOUNTER — Other Ambulatory Visit: Payer: Self-pay | Admitting: *Deleted

## 2021-12-22 MED ORDER — RIVAROXABAN 20 MG PO TABS: 20.0000 mg | ORAL_TABLET | Freq: Every day | ORAL | 0 refills | Status: DC

## 2021-12-22 NOTE — Telephone Encounter (Signed)
This RN spoke with the patient's wife per her call stating need for refill of xarelto not being renewed by his primary MD.  Pt was last seen in Jan 2022- with refills given for 1 year by Dr Jana Hakim. Expectation is for pt to be on lifelong anti coagulation therapy. Post work up - pt has been released from follow up at this office.  This RN gave a 30 day courtesy refill for continued therapy and notified Dr Laqueta Linden office - discussed above with Amy - who noted refill was denied by Dr Moreen Fowler. She will notify him of need for refills under his care since pt has been released from this office.  This RN discussed above with pt's wife.

## 2021-12-27 DIAGNOSIS — M25511 Pain in right shoulder: Secondary | ICD-10-CM | POA: Diagnosis not present

## 2021-12-27 DIAGNOSIS — M7541 Impingement syndrome of right shoulder: Secondary | ICD-10-CM | POA: Diagnosis not present

## 2021-12-27 DIAGNOSIS — M67811 Other specified disorders of synovium, right shoulder: Secondary | ICD-10-CM | POA: Diagnosis not present

## 2022-01-04 DIAGNOSIS — M7541 Impingement syndrome of right shoulder: Secondary | ICD-10-CM | POA: Diagnosis not present

## 2022-01-05 DIAGNOSIS — K862 Cyst of pancreas: Secondary | ICD-10-CM | POA: Diagnosis not present

## 2022-01-05 DIAGNOSIS — E782 Mixed hyperlipidemia: Secondary | ICD-10-CM | POA: Diagnosis not present

## 2022-01-05 DIAGNOSIS — Z86711 Personal history of pulmonary embolism: Secondary | ICD-10-CM | POA: Diagnosis not present

## 2022-01-05 DIAGNOSIS — E892 Postprocedural hypoparathyroidism: Secondary | ICD-10-CM | POA: Diagnosis not present

## 2022-01-05 DIAGNOSIS — I25119 Atherosclerotic heart disease of native coronary artery with unspecified angina pectoris: Secondary | ICD-10-CM | POA: Diagnosis not present

## 2022-01-05 DIAGNOSIS — F439 Reaction to severe stress, unspecified: Secondary | ICD-10-CM | POA: Diagnosis not present

## 2022-01-05 DIAGNOSIS — M25511 Pain in right shoulder: Secondary | ICD-10-CM | POA: Diagnosis not present

## 2022-01-22 DIAGNOSIS — M67819 Other specified disorders of synovium and tendon, unspecified shoulder: Secondary | ICD-10-CM | POA: Diagnosis not present

## 2022-01-22 DIAGNOSIS — M7541 Impingement syndrome of right shoulder: Secondary | ICD-10-CM | POA: Diagnosis not present

## 2022-01-22 DIAGNOSIS — S43431A Superior glenoid labrum lesion of right shoulder, initial encounter: Secondary | ICD-10-CM | POA: Diagnosis not present

## 2022-05-13 IMAGING — MR MR ABDOMEN WO/W CM MRCP
14 of 21 series · 26 of 48 positions shown · IV contrast (19 ml multihance)
Comparison: 11/25/2019, 12/30/2015

CLINICAL DATA: Follow-up cystic pancreatic lesion.

EXAM:
MRI ABDOMEN WITHOUT AND WITH CONTRAST (INCLUDING MRCP)
TECHNIQUE: Multiplanar multisequence MR imaging of the abdomen was performed
both before and after the administration of intravenous contrast.
Heavily T2-weighted images of the biliary and pancreatic ducts were
obtained, and three-dimensional MRCP images were rendered by post
processing.
CONTRAST:  19mL MULTIHANCE GADOBENATE DIMEGLUMINE 529 MG/ML IV SOLN

[Series 4: T2 · axial · 6.0mm · 1.25mm/px · 1 of 30 slices shown (1 of 2)]
[im 1/30]
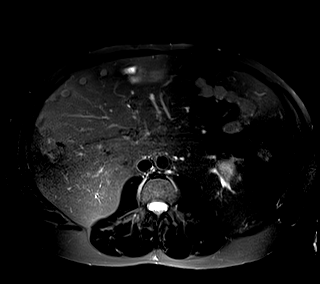

[Series 5: cor haste · coronal · 5.0mm · 0.78mm/px · 1 of 50 slices shown]
[im 1/50]
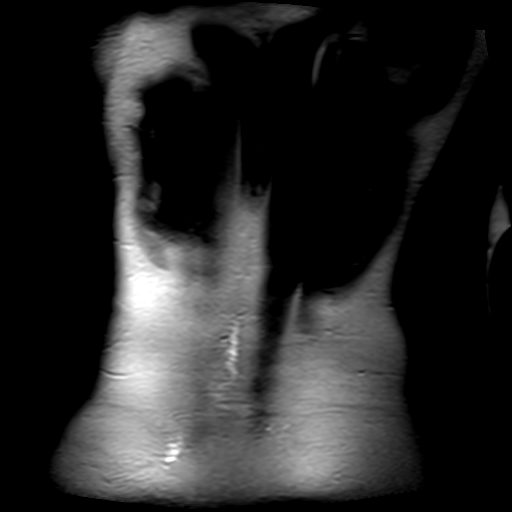

[Series 6: T1 · axial · 6.0mm · 0.78mm/px · z∈[-121,+110]mm · 2 of 72 slices shown]
[im 1/72]
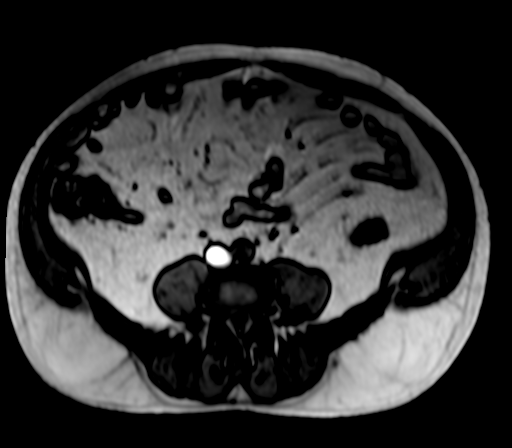
[im 72/72]
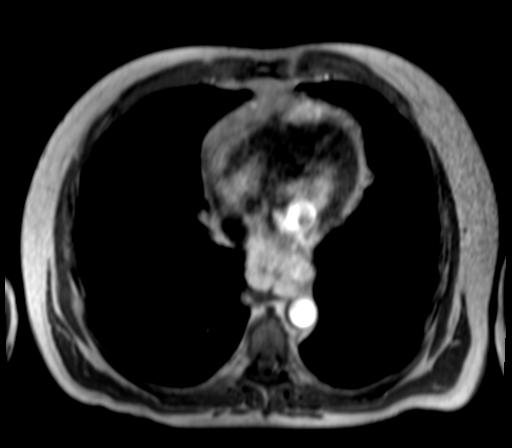

[Series 7: ep2d_diff_b50_500_800_p2_trig · axial · 6.0mm · 2.08mm/px · z∈[-62,+147]mm · 3 of 90 slices shown]
[im 1/90]
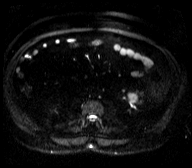
[im 45/90]
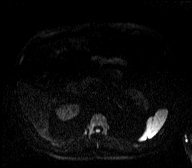
[im 90/90]
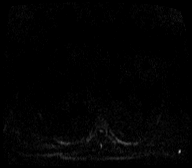

[Series 8: ep2d_diff_b50_500_800_p2_trig_adc · axial · 6.0mm · 2.08mm/px · 1 of 30 slices shown]
[im 1/30]
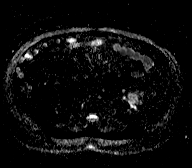

[Series 9: axial haste · axial · 6.0mm · 0.78mm/px · 1 of 30 slices shown]
[im 1/30]
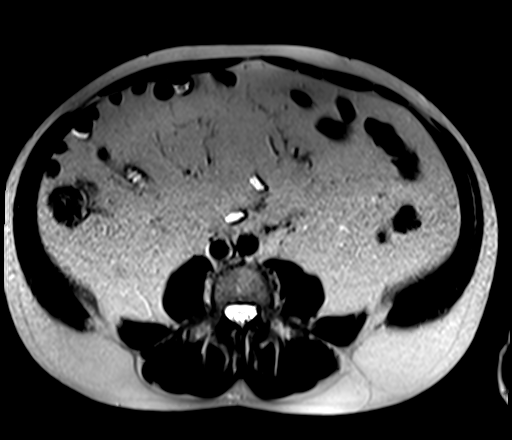

[Series 10: bSSFP · coronal · 5.0mm · 0.78mm/px · 1 of 42 slices shown (1 of 2)]
[im 1/42]
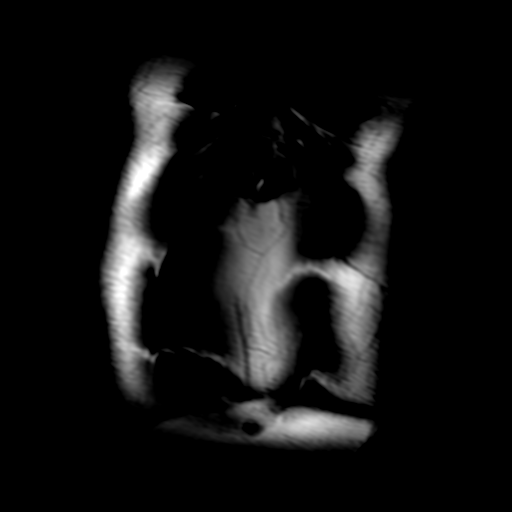

[Series 13: T2 · coronal · 3.0mm · 0.78mm/px · 2 of 48 slices shown (2 of 2)]
[im 1/48]
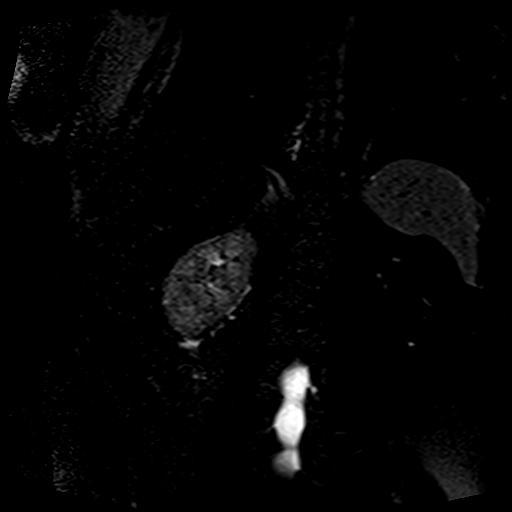
[im 48/48]
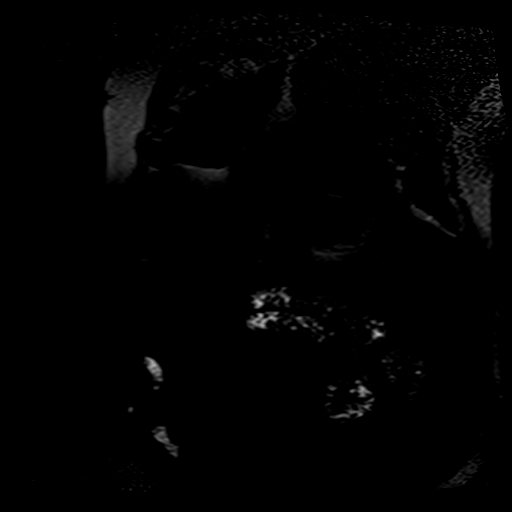

[Series 14: t2_haste_fs_cor_thick_sl_p2_384 · sagittal · 50.0mm · 0.78mm/px · 1 of 6 slices shown]
[im 1/6]
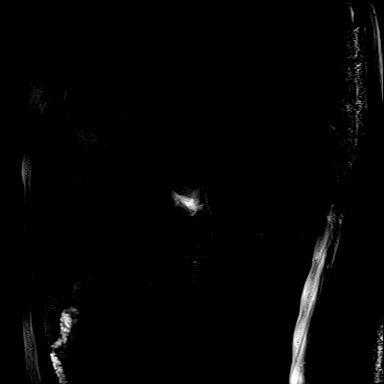

[Series 15: bSSFP · axial · 4.0mm · 0.78mm/px · z∈[-82,+122]mm · 2 of 52 slices shown (2 of 2)]
[im 1/52]
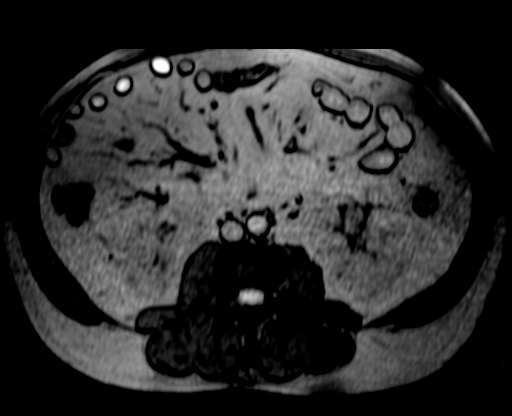
[im 52/52]
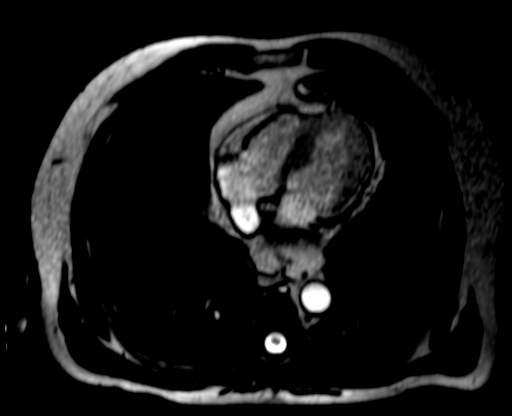

[Series 16: T1 dynamic · axial · non-contrast · 2.5mm · 0.78mm/px · z∈[-90,+127]mm · 3 of 88 slices shown]
[im 1/88]
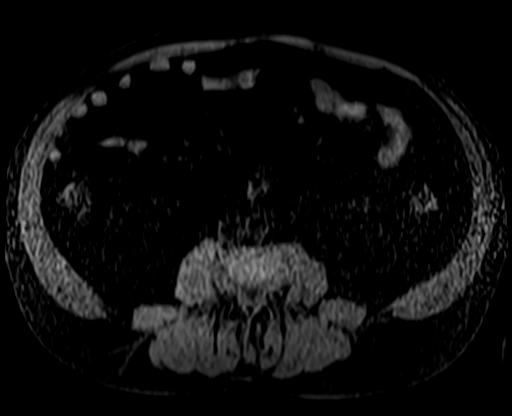
[im 44/88]
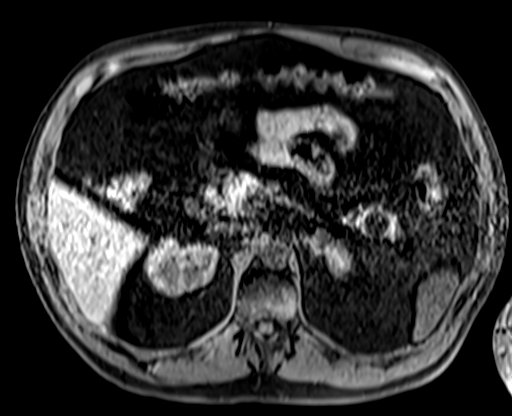
[im 88/88]
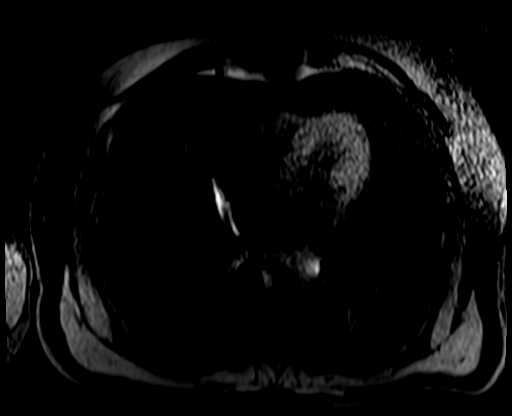

[Series 17: T1 dynamic post-contrast · axial · 2.5mm · 0.78mm/px · z∈[-90,+127]mm · 3 of 88 slices shown (1 of 3)]
[im 1/88]
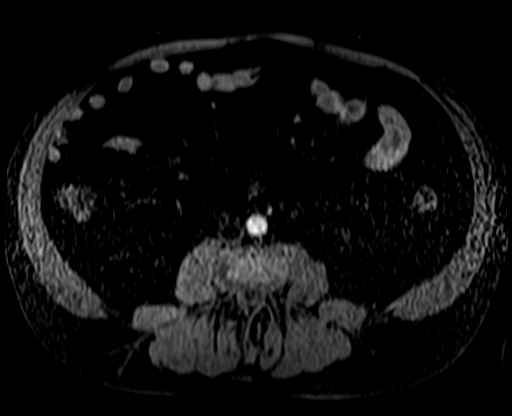
[im 44/88]
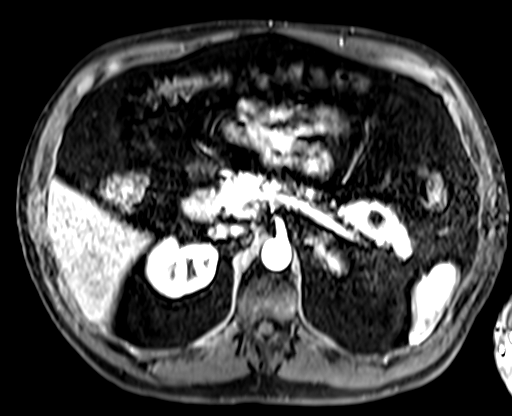
[im 88/88]
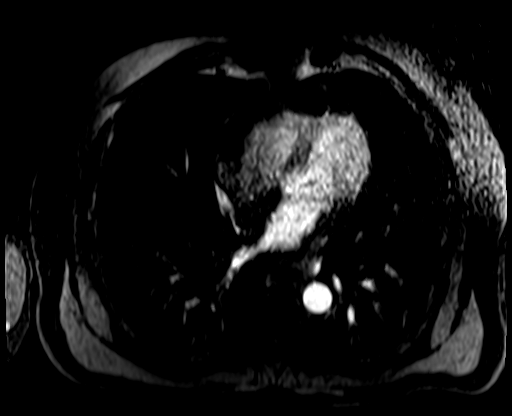

[Series 18: T1 dynamic post-contrast · axial · 2.5mm · 0.78mm/px · z∈[-90,+127]mm · 3 of 88 slices shown (2 of 3)]
[im 1/88]
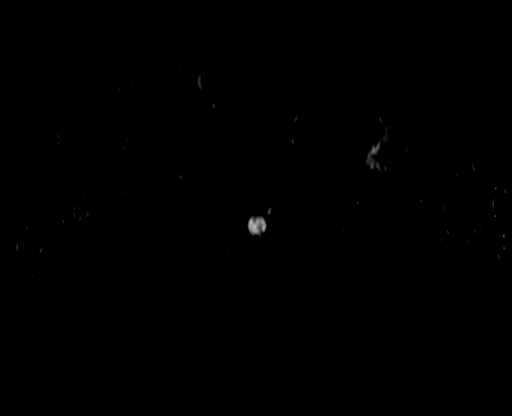
[im 44/88]
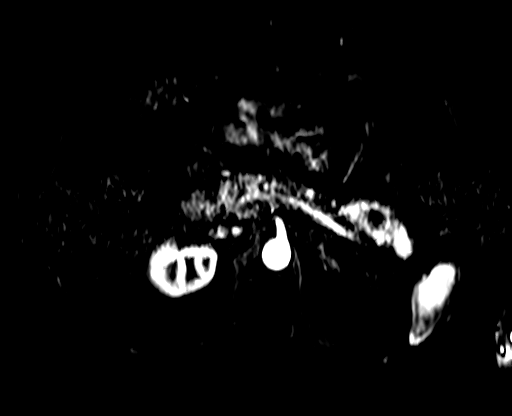
[im 88/88]
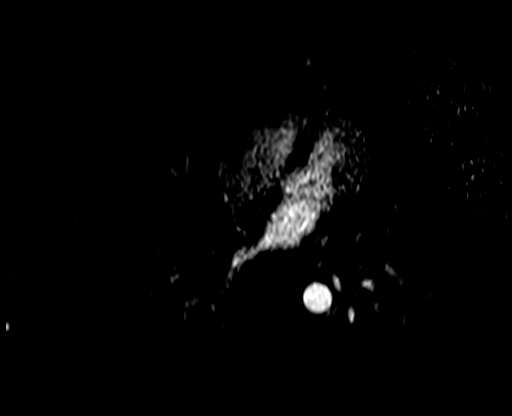

[Series 19: T1 dynamic post-contrast · axial · 2.5mm · 0.78mm/px · z∈[-90,+17]mm · 2 of 88 slices shown (3 of 3)]
[im 1/88]
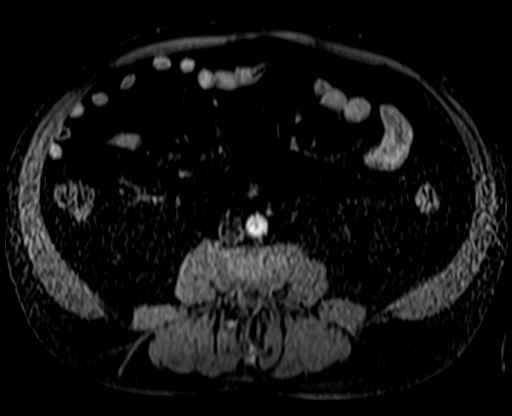
[im 44/88]
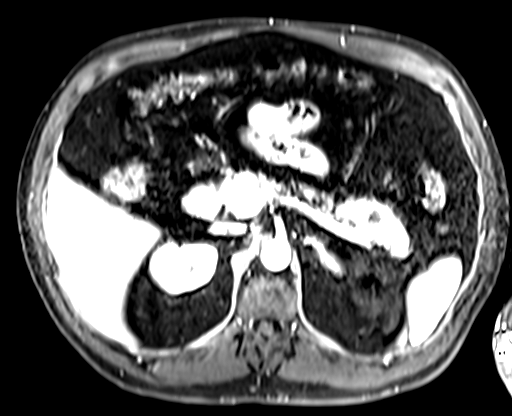

[26 of 48 positions shown; findings below may reference images not displayed]

FINDINGS: Lower chest: No acute findings.

Hepatobiliary: No hepatic masses identified. Small simple cyst again
seen in the liver dome adjacent to the IVC. Gallbladder is
unremarkable. No evidence of biliary ductal dilatation.

Pancreas: A 2.0 cm simple appearing cyst is again seen in the
pancreatic tail (image [DATE]). This shows slight increase in size
from 1.7 cm on 2385 exam. No other pancreatic lesions identified. No
evidence of pancreatic ductal dilatation.

Spleen:  Within normal limits in size and appearance.

Adrenals/Urinary Tract: No masses identified. No evidence of
hydronephrosis.

Stomach/Bowel: Small hiatal hernia again seen. Otherwise
unremarkable.

Vascular/Lymphatic: No pathologically enlarged lymph nodes
identified. No abdominal aortic aneurysm.

Other:  None.

Musculoskeletal:  No suspicious bone lesions identified.
IMPRESSION: 2.0 cm simple appearing cyst in the pancreatic tail is stable since
most recent exam approximately 11 months ago, and slightly increased
increase in size from 1.7 cm on 2385 exam. Recommend continued
follow-up by MRI in 1 year. This recommendation follows ACR
consensus guidelines: Management of Incidental Pancreatic Cysts: A
White Paper of the ACR Incidental Findings Committee. [HOSPITAL] 2385;[DATE].

Stable small hiatal hernia.

## 2022-07-12 DIAGNOSIS — I25119 Atherosclerotic heart disease of native coronary artery with unspecified angina pectoris: Secondary | ICD-10-CM | POA: Diagnosis not present

## 2022-07-12 DIAGNOSIS — M25511 Pain in right shoulder: Secondary | ICD-10-CM | POA: Diagnosis not present

## 2022-07-12 DIAGNOSIS — E892 Postprocedural hypoparathyroidism: Secondary | ICD-10-CM | POA: Diagnosis not present

## 2022-07-12 DIAGNOSIS — E782 Mixed hyperlipidemia: Secondary | ICD-10-CM | POA: Diagnosis not present

## 2022-07-12 DIAGNOSIS — Z86711 Personal history of pulmonary embolism: Secondary | ICD-10-CM | POA: Diagnosis not present

## 2022-07-12 DIAGNOSIS — F439 Reaction to severe stress, unspecified: Secondary | ICD-10-CM | POA: Diagnosis not present

## 2022-09-05 DIAGNOSIS — M75121 Complete rotator cuff tear or rupture of right shoulder, not specified as traumatic: Secondary | ICD-10-CM | POA: Diagnosis not present

## 2022-09-05 DIAGNOSIS — M19019 Primary osteoarthritis, unspecified shoulder: Secondary | ICD-10-CM | POA: Diagnosis not present

## 2022-09-05 DIAGNOSIS — S43431A Superior glenoid labrum lesion of right shoulder, initial encounter: Secondary | ICD-10-CM | POA: Diagnosis not present

## 2022-09-05 DIAGNOSIS — M7541 Impingement syndrome of right shoulder: Secondary | ICD-10-CM | POA: Diagnosis not present

## 2022-10-09 DIAGNOSIS — S46111A Strain of muscle, fascia and tendon of long head of biceps, right arm, initial encounter: Secondary | ICD-10-CM | POA: Diagnosis not present

## 2022-10-09 DIAGNOSIS — G8918 Other acute postprocedural pain: Secondary | ICD-10-CM | POA: Diagnosis not present

## 2022-10-09 DIAGNOSIS — M19011 Primary osteoarthritis, right shoulder: Secondary | ICD-10-CM | POA: Diagnosis not present

## 2022-10-09 DIAGNOSIS — S46011A Strain of muscle(s) and tendon(s) of the rotator cuff of right shoulder, initial encounter: Secondary | ICD-10-CM | POA: Diagnosis not present

## 2022-10-09 DIAGNOSIS — S43491A Other sprain of right shoulder joint, initial encounter: Secondary | ICD-10-CM | POA: Diagnosis not present

## 2022-10-09 DIAGNOSIS — M7541 Impingement syndrome of right shoulder: Secondary | ICD-10-CM | POA: Diagnosis not present

## 2022-10-16 DIAGNOSIS — M25611 Stiffness of right shoulder, not elsewhere classified: Secondary | ICD-10-CM | POA: Diagnosis not present

## 2022-10-16 DIAGNOSIS — M25511 Pain in right shoulder: Secondary | ICD-10-CM | POA: Diagnosis not present

## 2022-10-19 DIAGNOSIS — M25611 Stiffness of right shoulder, not elsewhere classified: Secondary | ICD-10-CM | POA: Diagnosis not present

## 2022-10-19 DIAGNOSIS — M25511 Pain in right shoulder: Secondary | ICD-10-CM | POA: Diagnosis not present

## 2022-10-22 DIAGNOSIS — Z4789 Encounter for other orthopedic aftercare: Secondary | ICD-10-CM | POA: Diagnosis not present

## 2022-10-22 DIAGNOSIS — M25511 Pain in right shoulder: Secondary | ICD-10-CM | POA: Diagnosis not present

## 2022-10-22 DIAGNOSIS — M25611 Stiffness of right shoulder, not elsewhere classified: Secondary | ICD-10-CM | POA: Diagnosis not present

## 2022-11-02 DIAGNOSIS — M25511 Pain in right shoulder: Secondary | ICD-10-CM | POA: Diagnosis not present

## 2022-11-02 DIAGNOSIS — M25611 Stiffness of right shoulder, not elsewhere classified: Secondary | ICD-10-CM | POA: Diagnosis not present

## 2022-11-06 DIAGNOSIS — M25611 Stiffness of right shoulder, not elsewhere classified: Secondary | ICD-10-CM | POA: Diagnosis not present

## 2022-11-06 DIAGNOSIS — M25511 Pain in right shoulder: Secondary | ICD-10-CM | POA: Diagnosis not present

## 2022-11-13 DIAGNOSIS — M25511 Pain in right shoulder: Secondary | ICD-10-CM | POA: Diagnosis not present

## 2022-11-13 DIAGNOSIS — M25611 Stiffness of right shoulder, not elsewhere classified: Secondary | ICD-10-CM | POA: Diagnosis not present

## 2022-11-16 DIAGNOSIS — M25511 Pain in right shoulder: Secondary | ICD-10-CM | POA: Diagnosis not present

## 2022-11-16 DIAGNOSIS — M25611 Stiffness of right shoulder, not elsewhere classified: Secondary | ICD-10-CM | POA: Diagnosis not present

## 2023-01-10 DIAGNOSIS — L729 Follicular cyst of the skin and subcutaneous tissue, unspecified: Secondary | ICD-10-CM | POA: Diagnosis not present

## 2023-01-10 DIAGNOSIS — E782 Mixed hyperlipidemia: Secondary | ICD-10-CM | POA: Diagnosis not present

## 2023-01-10 DIAGNOSIS — I25119 Atherosclerotic heart disease of native coronary artery with unspecified angina pectoris: Secondary | ICD-10-CM | POA: Diagnosis not present

## 2023-01-10 DIAGNOSIS — E892 Postprocedural hypoparathyroidism: Secondary | ICD-10-CM | POA: Diagnosis not present

## 2023-01-10 DIAGNOSIS — F439 Reaction to severe stress, unspecified: Secondary | ICD-10-CM | POA: Diagnosis not present

## 2023-01-10 DIAGNOSIS — Z86711 Personal history of pulmonary embolism: Secondary | ICD-10-CM | POA: Diagnosis not present

## 2023-02-20 ENCOUNTER — Encounter: Payer: Self-pay | Admitting: Plastic Surgery

## 2023-02-20 ENCOUNTER — Ambulatory Visit: Payer: PPO | Admitting: Plastic Surgery

## 2023-02-20 VITALS — BP 147/93 | HR 73 | Ht 68.5 in | Wt 204.0 lb

## 2023-02-20 DIAGNOSIS — D489 Neoplasm of uncertain behavior, unspecified: Secondary | ICD-10-CM

## 2023-02-20 DIAGNOSIS — L989 Disorder of the skin and subcutaneous tissue, unspecified: Secondary | ICD-10-CM

## 2023-02-20 NOTE — Progress Notes (Signed)
Referring Provider Tally Joe, MD [phone] [address] [place],  [place] [address]   CC:  Chief Complaint  Patient presents with   Consult      Daniel Bonilla is an 67 y.o. male.  HPI: Mr. Lazaro is a 67 year old male who presents today for evaluation of 2 skin lesions 1 on his forehead and 1 on his chest.  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  The lesion on his chest has also been present for quite a while but was evaluated by his dermatologist and felt not to be a carcinoma.  He is still interested in having it removed.  No Known Allergies  Outpatient Encounter Medications as of 02/20/2023  Medication Sig   nitroGLYCERIN (NITROSTAT) 0.4 MG SL tablet Place 1 tablet (0.4 mg total) under the tongue every 5 (five) minutes as needed for chest pain.   rivaroxaban (XARELTO) 20 MG TABS tablet Take 1 tablet (20 mg total) by mouth daily with supper.   No facility-administered encounter medications on file as of 02/20/2023.     Past Medical History:  Diagnosis Date   Anginal pain    CAD (coronary artery disease) 2/12   prior lateral MI in February of 2012 with distal LCX with BMS and 99% distal LAD. S/P cath in June 2013 - stent is patent, mild LV dysfunction with EF of 50% - managed medically.    HTN (hypertension)    Hyperlipidemia    Myocardial infarction    Snoring    Tobacco dependence     Past Surgical History:  Procedure Laterality Date   ANKLE FRACTURE SURGERY     BASAL CELL CARCINOMA EXCISION     eyelid, nose   CORONARY STENT PLACEMENT     HAND SURGERY     I & D EXTREMITY Left 05/02/2014   Procedure: IRRIGATION AND DEBRIDEMENT LEFT LEG, LEFT ANKLE  AND RIGHT ARM LACERATION AND CLOSURE OF  LACERATIONS TO RIGHT ARM AND LEFT LEG;  Surgeon: Kathryne Hitch, MD;  Location: [facility] OR;  Service: Orthopedics;  Laterality: Left;   INCISION  AND DRAINAGE OF WOUND N/A 05/02/2014   Procedure: IRRIGATION AND DEBRIDEMENT LIP LACERATION AND CLOSURE. CLOSURE OF RIGHT EAR LACERATION;  Surgeon: Glenna Fellows, MD;  Location: [facility] OR;  Service: Plastics;  Laterality: N/A;   NECK SURGERY     PARATHYROIDECTOMY Right 04/05/2020   Procedure: RIGHT INFERIOR PARATHYROIDECTOMY;  Surgeon: Darnell Level, MD;  Location: [facility] ORS;  Service: General;  Laterality: Right;    No family history on file.  Social History   Social History Narrative   Not on file     Review of Systems General: Denies fevers, chills, weight loss CV: Denies chest pain, shortness of breath, palpitations Skin: Soft tissue mass on the right forehead and skin lesion on the right anterior chest  Physical Exam    02/20/2023    1:07 PM 06/07/2020   12:00 PM 04/05/2020    2:27 PM  Vitals with BMI  Height 5' 8.5" 5' 9.5"   Weight 204 lbs 201 lbs 14 oz   BMI 30.56 29.4   Systolic 147 143 956  Diastolic 93 98 90  Pulse 73 87 82    General:  No acute distress,  Alert and oriented, Non-Toxic, Normal speech and affect Integument: The lesion on the forehead is a small 1  cm soft mobile mass which is consistent with a lipoma.  The lesion on the chest appears to be a 5 to 7 mm actinic keratoses.   Assessment/Plan Skin lesions: Patient has multiple skin lesions which can be easily removed in the office.  Patient is on Xarelto which will complicate the removal slightly.  I will not ask him to stop his Xarelto due to the small size of lesions but he understands that there may be bleeding and hematoma.  Will schedule at his request.  Santiago Glad 02/20/2023, 1:32 PM

## 2023-04-03 ENCOUNTER — Ambulatory Visit (INDEPENDENT_AMBULATORY_CARE_PROVIDER_SITE_OTHER): Payer: PPO | Admitting: Plastic Surgery

## 2023-04-03 VITALS — BP 120/81 | HR 97

## 2023-04-03 DIAGNOSIS — L821 Other seborrheic keratosis: Secondary | ICD-10-CM

## 2023-04-03 DIAGNOSIS — D170 Benign lipomatous neoplasm of skin and subcutaneous tissue of head, face and neck: Secondary | ICD-10-CM

## 2023-04-03 DIAGNOSIS — L989 Disorder of the skin and subcutaneous tissue, unspecified: Secondary | ICD-10-CM

## 2023-04-03 DIAGNOSIS — D489 Neoplasm of uncertain behavior, unspecified: Secondary | ICD-10-CM

## 2023-04-03 NOTE — Progress Notes (Signed)
Procedure Note  Preoperative Dx: Right forehead soft tissue mass, central chest skin lesion  Postoperative Dx: Same  Procedure: Excision of forehead mass and skin lesion  Anesthesia: Lidocaine 1% with 1:100,000 epinephrine and 0.25% Sensorcaine   Indication for Procedure: Removal for pathologic diagnosis  Description of Procedure: Risks and complications were explained to the patient including the need for additional procedures based on pathology, hematoma formation, recurrence.  Consent was confirmed and the patient understands the risks and benefits.  The potential complications and alternatives were explained and the patient consents.  The patient expressed understanding the option of not having the procedure and the risks of a scar.  Time out was called and all information was confirmed to be correct.    The area was prepped and drapped.  Local anesthetic was injected in the subcutaneous tissues.  After waiting for the local to take affect the right forehead lesion was addressed first.  A transverse incision using an existing Breakyl was made with a scalpel and dissection carried out down to the level of the mass.  The mass was removed without difficulty and the defect closed in layers with 5-0 Monocryl and 5-0 Prolene.  Attention was then turned to the central chest skin lesion.  The skin lesion was excised with a 1 mm margin.  The lesion was closed with interrupted 5-0 Monocryl's and a running 5-0 Prolene suture.  .  The surgical wound measured 2 cm on the forehead and the mass was approximately 1 x 2 cm in size.  The incision on the central chest was approximately 1 cm in length.  A dressing was applied.  The patient was given instructions on how to care for the area and a follow up appointment.  Jaycion tolerated the procedure well and there were no complications. The specimen was sent to pathology.

## 2023-04-08 ENCOUNTER — Ambulatory Visit (INDEPENDENT_AMBULATORY_CARE_PROVIDER_SITE_OTHER): Payer: PPO | Admitting: Physician Assistant

## 2023-04-08 DIAGNOSIS — L989 Disorder of the skin and subcutaneous tissue, unspecified: Secondary | ICD-10-CM

## 2023-04-08 DIAGNOSIS — Z9889 Other specified postprocedural states: Secondary | ICD-10-CM

## 2023-04-08 NOTE — Progress Notes (Signed)
This is a 67 year old gentleman seen in our office for follow-up evaluation status post excision of forehead mass and chest skin lesion by Dr. Ladona Ridgel on 04/03/2023.  Since his procedure he notes he has been doing well without any issues or concerns.  On exam incision clean dry and intact on the right forehead and chest with Prolene stitches in place no surrounding redness discharge or warmth.  The patient is approximate 5 days status post excision, I attempted to take several stitches out of the forehead but did not feel comfortable proceeding with removing the remaining as after I removed the 2 Prolenes I did not feel there was enough healing at that point.  The patient will follow-up on Friday for reevaluation and likely suture removal at that time.  He was given strict return precautions.  He verbalized understanding and agreement to today's plan.

## 2023-04-11 NOTE — Progress Notes (Signed)
Patient is a pleasant 67 year old male s/p excision of forehead mass and chest lesion performed 04/03/2023 in office by Dr. Ladona Ridgel who returns to clinic for postprocedural follow-up.  He was last seen here in clinic on 04/08/2023.  At that time, the skin edges were not quite healed enough to proceed with complete removal of the Prolene sutures and decision was made to leave them in place for an additional few days.  Reviewed pathology and the forehead lesion was consistent with lipoma and chest skin lesion was consistent with pigmented seborrheic keratosis.  No atypia found.  Today, patient is doing well.  No specific complaints.  He had not yet been applying any Vaseline or other moisturizing agents to the excision sites.  On exam, the skin edges from forehead lipoma excision site appears well-approximated.  Final few Prolene interrupted sutures are removed without complication or difficulty.  Dry appearing, but otherwise healing well.  No surrounding skin changes.  On the chest, inferiorly the skin edges appear well-approximated and a single Prolene simple interrupted sutures removed without complication.  However, approximately on the excision site there is a bit of overlying scabbing which had to be debrided in order to get to the underlying Prolene suture which was slightly buried.  It was removed, only minimal bleeding provoked.  No patient discomfort.  Placed some Vaseline over the excision sites and recommend continued Vaseline 1-2 times daily to keep the excision sites clean and healing well.  Discussed the benign pathology.  He will call the clinic should he have any questions or concerns, but otherwise no specific follow-up needed.

## 2023-04-12 ENCOUNTER — Encounter: Payer: Self-pay | Admitting: Physician Assistant

## 2023-04-12 ENCOUNTER — Ambulatory Visit (INDEPENDENT_AMBULATORY_CARE_PROVIDER_SITE_OTHER): Payer: PPO | Admitting: Physician Assistant

## 2023-04-12 VITALS — BP 141/92 | HR 92

## 2023-04-12 DIAGNOSIS — Z4802 Encounter for removal of sutures: Secondary | ICD-10-CM

## 2023-04-12 DIAGNOSIS — D170 Benign lipomatous neoplasm of skin and subcutaneous tissue of head, face and neck: Secondary | ICD-10-CM

## 2023-04-12 DIAGNOSIS — L821 Other seborrheic keratosis: Secondary | ICD-10-CM

## 2023-05-10 ENCOUNTER — Telehealth: Payer: Self-pay

## 2023-05-10 DIAGNOSIS — Z9189 Other specified personal risk factors, not elsewhere classified: Secondary | ICD-10-CM

## 2023-05-10 NOTE — Progress Notes (Signed)
[facility] Customer service manager [facility]) Quality Pharmacy Team [facility]   05/10/2023  Daniel Bonilla 1955/12/14 [id number]  Reason for referral: Medication Adherence  Referral source:  Payor report  Current insurance: [employer]  Reason for call: Patient was outreached for prescription refill related to statin quality measure.   Outreach:  Unsuccessful telephone call attempt #1 to patient.   HIPAA compliant voicemail left requesting a return call  Plan:  -I will make another outreach attempt to patient in 7-14 business days.  Thank you for allowing [facility] pharmacy to be a part of this patient's care.  Cephus Shelling, PharmD Clinical Pharmacist [facility] Cell: [phone]

## 2023-08-15 DIAGNOSIS — I25119 Atherosclerotic heart disease of native coronary artery with unspecified angina pectoris: Secondary | ICD-10-CM | POA: Diagnosis not present

## 2023-08-15 DIAGNOSIS — E892 Postprocedural hypoparathyroidism: Secondary | ICD-10-CM | POA: Diagnosis not present

## 2023-08-15 DIAGNOSIS — F439 Reaction to severe stress, unspecified: Secondary | ICD-10-CM | POA: Diagnosis not present

## 2023-08-15 DIAGNOSIS — E782 Mixed hyperlipidemia: Secondary | ICD-10-CM | POA: Diagnosis not present

## 2023-08-15 DIAGNOSIS — Z86711 Personal history of pulmonary embolism: Secondary | ICD-10-CM | POA: Diagnosis not present

## 2023-08-15 DIAGNOSIS — L821 Other seborrheic keratosis: Secondary | ICD-10-CM | POA: Diagnosis not present

## 2023-08-15 DIAGNOSIS — J432 Centrilobular emphysema: Secondary | ICD-10-CM | POA: Diagnosis not present

## 2023-08-15 DIAGNOSIS — Z0000 Encounter for general adult medical examination without abnormal findings: Secondary | ICD-10-CM | POA: Diagnosis not present

## 2023-08-15 DIAGNOSIS — Z125 Encounter for screening for malignant neoplasm of prostate: Secondary | ICD-10-CM | POA: Diagnosis not present

## 2023-08-15 DIAGNOSIS — D6869 Other thrombophilia: Secondary | ICD-10-CM | POA: Diagnosis not present

## 2023-08-15 DIAGNOSIS — Z86018 Personal history of other benign neoplasm: Secondary | ICD-10-CM | POA: Diagnosis not present

## 2023-08-15 DIAGNOSIS — Z1331 Encounter for screening for depression: Secondary | ICD-10-CM | POA: Diagnosis not present

## 2024-02-11 DIAGNOSIS — R030 Elevated blood-pressure reading, without diagnosis of hypertension: Secondary | ICD-10-CM | POA: Diagnosis not present

## 2024-02-11 DIAGNOSIS — I25119 Atherosclerotic heart disease of native coronary artery with unspecified angina pectoris: Secondary | ICD-10-CM | POA: Diagnosis not present

## 2024-02-11 DIAGNOSIS — Z86711 Personal history of pulmonary embolism: Secondary | ICD-10-CM | POA: Diagnosis not present

## 2024-02-11 DIAGNOSIS — E782 Mixed hyperlipidemia: Secondary | ICD-10-CM | POA: Diagnosis not present

## 2024-02-11 DIAGNOSIS — E892 Postprocedural hypoparathyroidism: Secondary | ICD-10-CM | POA: Diagnosis not present

## 2024-04-30 DIAGNOSIS — I209 Angina pectoris, unspecified: Secondary | ICD-10-CM | POA: Insufficient documentation

## 2024-04-30 DIAGNOSIS — I219 Acute myocardial infarction, unspecified: Secondary | ICD-10-CM | POA: Insufficient documentation

## 2024-04-30 DIAGNOSIS — R079 Chest pain, unspecified: Secondary | ICD-10-CM | POA: Insufficient documentation

## 2024-05-01 ENCOUNTER — Ambulatory Visit

## 2024-05-01 VITALS — BP 126/72 | HR 68 | Resp 16 | Ht 68.0 in | Wt 201.0 lb

## 2024-05-01 DIAGNOSIS — R079 Chest pain, unspecified: Secondary | ICD-10-CM | POA: Diagnosis not present

## 2024-05-01 DIAGNOSIS — H538 Other visual disturbances: Secondary | ICD-10-CM

## 2024-05-01 DIAGNOSIS — E782 Mixed hyperlipidemia: Secondary | ICD-10-CM

## 2024-05-01 DIAGNOSIS — I2510 Atherosclerotic heart disease of native coronary artery without angina pectoris: Secondary | ICD-10-CM

## 2024-05-01 HISTORY — DX: Other visual disturbances: H53.8

## 2024-05-01 MED ORDER — ROSUVASTATIN CALCIUM 10 MG PO TABS: 10.0000 mg | ORAL_TABLET | ORAL | 3 refills | Status: DC
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
## 2024-05-01 NOTE — Assessment & Plan Note (Signed)
 Atypical chest pain follow-up with stress test as reviewed above.  Will also obtain transthoracic echocardiogram to rule out any significant cardiac structural and functional abnormalities.

## 2024-05-01 NOTE — Patient Instructions (Signed)
 Medication Instructions:  Your physician has recommended you make the following change in your medication:   START: Crestor  10 mg three times weekly  *If you need a refill on your cardiac medications before your next appointment, please call your pharmacy*  Lab Work: None If you have labs (blood work) drawn today and your tests are completely normal, you will receive your results only by: MyChart Message (if you have MyChart) OR A paper copy in the mail If you have any lab test that is abnormal or we need to change your treatment, we will call you to review the results.  Testing/Procedures: Your physician has requested that you have a carotid duplex. This test is an ultrasound of the carotid arteries in your neck. It looks at blood flow through these arteries that supply the brain with blood. Allow one hour for this exam. There are no restrictions or special instructions.  Your physician has requested that you have an echocardiogram. Echocardiography is a painless test that uses sound waves to create images of your heart. It provides your doctor with information about the size and shape of your heart and how well your heart's chambers and valves are working. This procedure takes approximately one hour. There are no restrictions for this procedure. Please do NOT wear cologne, perfume, aftershave, or lotions (deodorant is allowed). Please arrive 15 minutes prior to your appointment time.  Please note: We ask at that you not bring children with you during ultrasound (echo/ vascular) testing. Due to room size and safety concerns, children are not allowed in the ultrasound rooms during exams. Our front office staff cannot provide observation of children in our lobby area while testing is being conducted. An adult accompanying a patient to their appointment will only be allowed in the ultrasound room at the discretion of the ultrasound technician under special circumstances. We apologize for any  inconvenience.  Cardiac PET Stress Test  Follow-Up: At [facility], you and your health needs are our priority.  As part of our continuing mission to provide you with exceptional heart care, our providers are all part of one team.  This team includes your primary Cardiologist (physician) and Advanced Practice Providers or APPs (Physician Assistants and Nurse Practitioners) who all work together to provide you with the care you need, when you need it.  Your next appointment:   3 month(s)  Provider:   Alean Kobus, MD    We recommend signing up for the patient portal called MyChart.  Sign up information is provided on this After Visit Summary.  MyChart is used to connect with patients for Virtual Visits (Telemedicine).  Patients are able to view lab/test results, encounter notes, upcoming appointments, etc.  Non-urgent messages can be sent to your provider as well.   To learn more about what you can do with MyChart, go to [email or web address].   Other Instructions None

## 2024-05-01 NOTE — Assessment & Plan Note (Signed)
 S/p prior inferolateral MI February 2012 s/p bare-metal stent of distal LCx. Subsequent Myoview  abnormal stress test 2013 Followed up with cardiac cath in 2013 with patent stent, no further revascularization done.  Had remained on aspirin  and later switched to Xarelto  in the setting of DVT PE in January 2021. Has not been taking statins.  Now with atypical symptoms of chest pain.  - Will further assess for any significant ischemia burden with cardiac PET stress test, given his prior history of PCI and existing EKG changes suggestive of prior MI and prior history of abnormal Myoview  stress test.  - Advised him to avoid moderate to heavy exertion. If any significant symptoms should call 911 or head to the ER.

## 2024-05-01 NOTE — Assessment & Plan Note (Signed)
 Right visual blurring right eye visual fields. Quickly resolved within few minutes. Advised him to have evaluation with ophthalmologist.  Given his risk factors cannot entirely rule out TIA.  No significant carotid bruit but however given the risk factors will evaluate for any significant carotid artery disease with ultrasound bilateral carotids.

## 2024-05-01 NOTE — Assessment & Plan Note (Signed)
 Discussed about lipid-lowering therapy and its importance in preventing progression of plaque buildup in the blood vessels and preventing heart attack and stroke.  Reviewed benefits of statins.  Reviewed potential risks associated with muscle aches, liver functions. Recommended strongly to resume statin therapy as he seems to have tolerated it well previously without any side effects.  Resume rosuvastatin  10 mg and suggested to use this 3 times a week initially and titrate up the dose to every day on subsequent follow-ups.  He is agreeable to this.

## 2024-05-20 ENCOUNTER — Ambulatory Visit

## 2024-05-20 ENCOUNTER — Ambulatory Visit (INDEPENDENT_AMBULATORY_CARE_PROVIDER_SITE_OTHER)

## 2024-05-20 DIAGNOSIS — E782 Mixed hyperlipidemia: Secondary | ICD-10-CM

## 2024-05-20 DIAGNOSIS — R079 Chest pain, unspecified: Secondary | ICD-10-CM

## 2024-05-20 DIAGNOSIS — I2510 Atherosclerotic heart disease of native coronary artery without angina pectoris: Secondary | ICD-10-CM | POA: Diagnosis not present

## 2024-05-20 DIAGNOSIS — H538 Other visual disturbances: Secondary | ICD-10-CM

## 2024-05-20 LAB — ECHOCARDIOGRAM COMPLETE
AR max vel: 2.49 cm2
AV Area VTI: 2.79 cm2
AV Area mean vel: 2.65 cm2
AV Mean grad: 4.5 mmHg
AV Peak grad: 9.1 mmHg
Ao pk vel: 1.51 m/s
Area-P 1/2: 4.41 cm2
MV VTI: 2.03 cm2
S' Lateral: 2.5 cm

## 2024-05-21 DIAGNOSIS — L308 Other specified dermatitis: Secondary | ICD-10-CM | POA: Diagnosis not present

## 2024-05-23 ENCOUNTER — Ambulatory Visit: Payer: Self-pay

## 2024-05-29 ENCOUNTER — Telehealth: Payer: Self-pay

## 2024-05-29 NOTE — Telephone Encounter (Signed)
 Received the following message from Dr. Liborio below:  No significant abnormalities on ultrasound of the neck vessels. This is reassuring  Attempted to call the patient. Patient did not answer the phone. A message was left asking the patient to call the office back.

## 2024-06-03 ENCOUNTER — Telehealth: Payer: Self-pay

## 2024-06-03 NOTE — Telephone Encounter (Signed)
 Attempted to call the patient and inform him of the results of his test below:  No significant abnormalities on ultrasound of the neck vessels. This is reassuring  Patient did not answer the phone and a message was left for him to call back the office.

## 2024-06-05 ENCOUNTER — Telehealth: Payer: Self-pay

## 2024-06-05 NOTE — Telephone Encounter (Signed)
 Received the following message below from Dr. Liborio:  No significant abnormalities on ultrasound of the neck vessels. This is reassuring  Attempted to call the patient and inform him of the results. Patient did not answer the phone and a message was left for the patient to call back.

## 2024-06-08 ENCOUNTER — Encounter (HOSPITAL_COMMUNITY): Payer: Self-pay

## 2024-06-11 ENCOUNTER — Ambulatory Visit
Admission: RE | Admit: 2024-06-11 | Discharge: 2024-06-11 | Disposition: A | Discharge status: Home / Self Care | Source: Ambulatory Visit

## 2024-06-11 ENCOUNTER — Telehealth: Payer: Self-pay

## 2024-06-11 DIAGNOSIS — R911 Solitary pulmonary nodule: Secondary | ICD-10-CM | POA: Insufficient documentation

## 2024-06-11 DIAGNOSIS — J439 Emphysema, unspecified: Secondary | ICD-10-CM | POA: Insufficient documentation

## 2024-06-11 DIAGNOSIS — I2510 Atherosclerotic heart disease of native coronary artery without angina pectoris: Secondary | ICD-10-CM | POA: Insufficient documentation

## 2024-06-11 DIAGNOSIS — R079 Chest pain, unspecified: Secondary | ICD-10-CM | POA: Diagnosis not present

## 2024-06-11 DIAGNOSIS — K449 Diaphragmatic hernia without obstruction or gangrene: Secondary | ICD-10-CM | POA: Insufficient documentation

## 2024-06-11 DIAGNOSIS — H538 Other visual disturbances: Secondary | ICD-10-CM | POA: Diagnosis not present

## 2024-06-11 DIAGNOSIS — I700 Atherosclerosis of aorta: Secondary | ICD-10-CM | POA: Diagnosis not present

## 2024-06-11 DIAGNOSIS — K8020 Calculus of gallbladder without cholecystitis without obstruction: Secondary | ICD-10-CM | POA: Diagnosis not present

## 2024-06-11 DIAGNOSIS — E782 Mixed hyperlipidemia: Secondary | ICD-10-CM | POA: Diagnosis not present

## 2024-06-11 LAB — NM PET CT CARDIAC PERFUSION MULTI W/ABSOLUTE BLOODFLOW
LV dias vol: 103 mL (ref 62–150)
LV sys vol: 56 mL (ref 4.2–5.8)
MBFR: 1.67
Nuc Rest EF: 46 %
Nuc Stress EF: 55 %
Peak HR: 90 {beats}/min
Rest HR: 77 {beats}/min
Rest MBF: 0.96 ml/g/min
Rest Nuclear Isotope Dose: 23.7 mCi
SRS: 5
SSS: 12
ST Depression (mm): 0 mm
Stress MBF: 1.6 ml/g/min
Stress Nuclear Isotope Dose: 23.7 mCi
TID: 0.98

## 2024-06-11 MED ORDER — REGADENOSON 0.4 MG/5ML IV SOLN
0.4000 mg | Freq: Once | INTRAVENOUS | Status: AC
  Administered 2024-06-11: 0.4 mg via INTRAVENOUS
  Filled 2024-06-11: qty 5

## 2024-06-11 MED ORDER — REGADENOSON 0.4 MG/5ML IV SOLN
INTRAVENOUS | Status: AC
  Filled 2024-06-11: qty 5

## 2024-06-11 MED ORDER — RUBIDIUM RB82 GENERATOR (RUBYFILL)
25.0000 | PACK | Freq: Once | INTRAVENOUS | Status: AC
  Administered 2024-06-11: 23.68 via INTRAVENOUS

## 2024-06-11 NOTE — Progress Notes (Signed)
 Patient presents for a cardiac PET stress test and tolerated procedure without incident. Patient maintained acceptable vital signs throughout the test and was offered caffeine after test.  Patient ambulated out of department with a steady gait.

## 2024-06-11 NOTE — Telephone Encounter (Signed)
 Received a call from [facility] and they reported the following out of range result from the patients Cardiac PET below:   A left lower lobe pulmonary nodule of 1.0 cm may be new or enlarged since 11/24/2019. Recommend diagnostic noncontrast chest CT for further evaluation.  Please advise.

## 2024-06-11 NOTE — Telephone Encounter (Signed)
 Caller Sherrlyn Dolores) is reporting out of range results

## 2024-06-12 NOTE — Telephone Encounter (Signed)
Results reviewed with pt as per Dr. Revankar's note.  Pt verbalized understanding and had no additional questions. Routed to PCP.  
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
## 2024-06-15 ENCOUNTER — Ambulatory Visit: Attending: Cardiology | Admitting: Cardiology

## 2024-06-15 ENCOUNTER — Encounter: Payer: Self-pay | Admitting: Cardiology

## 2024-06-15 VITALS — BP 100/80 | HR 86 | Ht 68.0 in | Wt 204.0 lb

## 2024-06-15 DIAGNOSIS — I519 Heart disease, unspecified: Secondary | ICD-10-CM | POA: Diagnosis not present

## 2024-06-15 DIAGNOSIS — J439 Emphysema, unspecified: Secondary | ICD-10-CM | POA: Insufficient documentation

## 2024-06-15 DIAGNOSIS — R911 Solitary pulmonary nodule: Secondary | ICD-10-CM | POA: Diagnosis not present

## 2024-06-15 DIAGNOSIS — I2510 Atherosclerotic heart disease of native coronary artery without angina pectoris: Secondary | ICD-10-CM

## 2024-06-15 DIAGNOSIS — K862 Cyst of pancreas: Secondary | ICD-10-CM | POA: Diagnosis not present

## 2024-06-15 DIAGNOSIS — E782 Mixed hyperlipidemia: Secondary | ICD-10-CM | POA: Diagnosis not present

## 2024-06-15 HISTORY — DX: Emphysema, unspecified: J43.9

## 2024-06-15 MED ORDER — VALSARTAN 40 MG PO TABS: 40.0000 mg | ORAL_TABLET | Freq: Two times a day (BID) | ORAL | 3 refills | Status: AC

## 2024-06-15 MED ORDER — PRAVASTATIN SODIUM 20 MG PO TABS: 20.0000 mg | ORAL_TABLET | Freq: Every day | ORAL | 3 refills | Status: DC

## 2024-06-15 MED ORDER — VALSARTAN 40 MG PO TABS: 40.0000 mg | ORAL_TABLET | Freq: Every day | ORAL | 3 refills | Status: DC

## 2024-06-15 NOTE — Patient Instructions (Addendum)
 Medication Instructions:  Your physician has recommended you make the following change in your medication:   STOP: Rosuvastatin  START: Pravastatin  20 mg daily START: Valsartan  40 mg two times daily  *If you need a refill on your cardiac medications before your next appointment, please call your pharmacy*  Lab Work: Your physician recommends that you return for lab work in:   Labs today: BMP, CBC  If you have labs (blood work) drawn today and your tests are completely normal, you will receive your results only by: MyChart Message (if you have MyChart) OR A paper copy in the mail If you have any lab test that is abnormal or we need to change your treatment, we will call you to review the results.  Testing/Procedures: Non-Cardiac CT scanning, (CAT scanning), is a noninvasive, special x-ray that produces cross-sectional images of the body using x-rays and a computer. CT scans help physicians diagnose and treat medical conditions. For some CT exams, a contrast material is used to enhance visibility in the area of the body being studied. CT scans provide greater clarity and reveal more details than regular x-ray exams.   [facility] A DEPT OF [facility] AT [place] [address] [place] [address] Dept: [phone] Loc: [phone]  Shelly Shoultz  06/15/2024  You are scheduled for a Cardiac Catheterization on Wednesday, August 20 with Dr. Newman Lawrence.  1. Please arrive at the [facility] (Main Entrance A) at [facility]: [address] [place], [place] [address] at 6:30 AM (This time is 2 hour(s) before your procedure to ensure your preparation).   Free valet parking service is available. You will check in at ADMITTING. The support person will be asked to wait in the waiting room.  It is OK to have someone drop you off and come back when you are ready to be discharged.    Special note: Every effort is made to  have your procedure done on time. Please understand that emergencies sometimes delay scheduled procedures.  2. Diet: No solid foods after midnight. You may have clear liquids until you arrive at the hospital.  List of approved liquids water , clear juice, clear tea, black coffee, fruit juices, non-citric and without pulp, carbonated beverages, Gatorade, Kool -Aid, plain Jello-O and plain ice popsicles.   3. Hydration: You need to be well hydrated before your procedure time. You may drink approved liquids (see below) until you arrive at the hospital. On the way to the hospital, please drink a 16-oz (1 plastic bottle) of water .   List of approved liquids water , clear juice, clear tea, black coffee, fruit juices, non-citric and without pulp, carbonated beverages, Gatorade, Kool -Aid, plain Jello-O and plain ice popsicles.   4. Labs: You will need to have blood drawn on Monday, August 11 at [facility]: [address], [place], [place] . You do not need to be fasting.  5. Medication instructions in preparation for your procedure:   Contrast Allergy: No  Stop taking Xarelto  two days before your procedure on Monday 06/22/24  On the morning of your procedure, take your Aspirin  81 mg and any morning medicines NOT listed above.  You may use sips of water .  6. Plan to go home the same day, you will only stay overnight if medically necessary. 7. Bring a current list of your medications and current insurance cards. 8. You MUST have a responsible person to drive you home. 9. Someone MUST be with you  the first 24 hours after you arrive home or your discharge will be delayed. 10. Please wear clothes that are easy to get on and off and wear slip-on shoes.  Thank you for allowing us  to care for you!   -- [facility]   Follow-Up: At [facility], you and your health needs are our priority.  As part of our continuing mission to provide you with exceptional heart care,  our providers are all part of one team.  This team includes your primary Cardiologist (physician) and Advanced Practice Providers or APPs (Physician Assistants and Nurse Practitioners) who all work together to provide you with the care you need, when you need it.  Your next appointment:   4 week(s)  Provider:   Alean Kobus, MD    We recommend signing up for the patient portal called MyChart.  Sign up information is provided on this After Visit Summary.  MyChart is used to connect with patients for Virtual Visits (Telemedicine).  Patients are able to view lab/test results, encounter notes, upcoming appointments, etc.  Non-urgent messages can be sent to your provider as well.   To learn more about what you can do with MyChart, go to [email or web address].   Other Instructions None

## 2024-06-16 ENCOUNTER — Ambulatory Visit: Payer: Self-pay | Admitting: Cardiology

## 2024-06-16 LAB — CBC
Hematocrit: 47.9 % (ref 37.5–51.0)
Hemoglobin: 15.6 g/dL (ref 13.0–17.7)
MCH: 29.1 pg (ref 26.6–33.0)
MCHC: 32.6 g/dL (ref 31.5–35.7)
MCV: 89 fL (ref 79–97)
Platelets: 267 x10E3/uL (ref 150–450)
RBC: 5.37 x10E6/uL (ref 4.14–5.80)
RDW: 13.2 % (ref 11.6–15.4)
WBC: 7.6 x10E3/uL (ref 3.4–10.8)

## 2024-06-16 LAB — BASIC METABOLIC PANEL WITH GFR
BUN/Creatinine Ratio: 13 (ref 10–24)
BUN: 14 mg/dL (ref 8–27)
CO2: 19 mmol/L — ABNORMAL LOW (ref 20–29)
Calcium: 9.1 mg/dL (ref 8.6–10.2)
Chloride: 104 mmol/L (ref 96–106)
Creatinine, Ser: 1.1 mg/dL (ref 0.76–1.27)
Glucose: 85 mg/dL (ref 70–99)
Potassium: 4.7 mmol/L (ref 3.5–5.2)
Sodium: 139 mmol/L (ref 134–144)
eGFR: 73 mL/min/1.73 (ref >59)

## 2024-06-18 ENCOUNTER — Emergency Department (HOSPITAL_BASED_OUTPATIENT_CLINIC_OR_DEPARTMENT_OTHER)

## 2024-06-18 ENCOUNTER — Other Ambulatory Visit: Payer: Self-pay

## 2024-06-18 ENCOUNTER — Emergency Department (HOSPITAL_BASED_OUTPATIENT_CLINIC_OR_DEPARTMENT_OTHER)
Admission: EM | Admit: 2024-06-18 | Discharge: 2024-06-18 | Disposition: A | Discharge status: Home / Self Care | Attending: Emergency Medicine | Admitting: Emergency Medicine

## 2024-06-18 ENCOUNTER — Encounter (HOSPITAL_BASED_OUTPATIENT_CLINIC_OR_DEPARTMENT_OTHER): Payer: Self-pay | Admitting: Emergency Medicine

## 2024-06-18 DIAGNOSIS — Z7901 Long term (current) use of anticoagulants: Secondary | ICD-10-CM | POA: Insufficient documentation

## 2024-06-18 DIAGNOSIS — Y92410 Unspecified street and highway as the place of occurrence of the external cause: Secondary | ICD-10-CM | POA: Diagnosis not present

## 2024-06-18 DIAGNOSIS — M47812 Spondylosis without myelopathy or radiculopathy, cervical region: Secondary | ICD-10-CM | POA: Diagnosis not present

## 2024-06-18 DIAGNOSIS — S0990XA Unspecified injury of head, initial encounter: Secondary | ICD-10-CM | POA: Diagnosis not present

## 2024-06-18 DIAGNOSIS — S199XXA Unspecified injury of neck, initial encounter: Secondary | ICD-10-CM | POA: Diagnosis not present

## 2024-06-18 DIAGNOSIS — Z23 Encounter for immunization: Secondary | ICD-10-CM | POA: Insufficient documentation

## 2024-06-18 DIAGNOSIS — S0181XA Laceration without foreign body of other part of head, initial encounter: Secondary | ICD-10-CM | POA: Insufficient documentation

## 2024-06-18 MED ORDER — TETANUS-DIPHTH-ACELL PERTUSSIS 5-2.5-18.5 LF-MCG/0.5 IM SUSY
0.5000 mL | PREFILLED_SYRINGE | Freq: Once | INTRAMUSCULAR | Status: AC
  Administered 2024-06-18: 0.5 mL via INTRAMUSCULAR
  Filled 2024-06-18: qty 0.5

## 2024-06-18 MED ORDER — LIDOCAINE HCL (PF) 1 % IJ SOLN
5.0000 mL | Freq: Once | INTRAMUSCULAR | Status: AC
  Administered 2024-06-18: 5 mL
  Filled 2024-06-18: qty 5

## 2024-06-18 MED ORDER — LIDOCAINE-EPINEPHRINE-TETRACAINE (LET) TOPICAL GEL
3.0000 mL | Freq: Once | TOPICAL | Status: AC
  Administered 2024-06-18: 3 mL via TOPICAL
  Filled 2024-06-18: qty 3
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
## 2024-06-19 ENCOUNTER — Ambulatory Visit (INDEPENDENT_AMBULATORY_CARE_PROVIDER_SITE_OTHER)
Admission: RE | Admit: 2024-06-19 | Discharge: 2024-06-19 | Disposition: A | Discharge status: Home / Self Care | Source: Ambulatory Visit | Attending: Cardiology | Admitting: Cardiology

## 2024-06-19 DIAGNOSIS — I519 Heart disease, unspecified: Secondary | ICD-10-CM | POA: Diagnosis not present

## 2024-06-19 DIAGNOSIS — K862 Cyst of pancreas: Secondary | ICD-10-CM

## 2024-06-19 DIAGNOSIS — R911 Solitary pulmonary nodule: Secondary | ICD-10-CM

## 2024-06-19 DIAGNOSIS — I2510 Atherosclerotic heart disease of native coronary artery without angina pectoris: Secondary | ICD-10-CM | POA: Diagnosis not present

## 2024-06-19 DIAGNOSIS — E782 Mixed hyperlipidemia: Secondary | ICD-10-CM

## 2024-06-19 DIAGNOSIS — J432 Centrilobular emphysema: Secondary | ICD-10-CM | POA: Diagnosis not present

## 2024-06-19 DIAGNOSIS — R918 Other nonspecific abnormal finding of lung field: Secondary | ICD-10-CM | POA: Diagnosis not present

## 2024-06-20 ENCOUNTER — Other Ambulatory Visit (HOSPITAL_BASED_OUTPATIENT_CLINIC_OR_DEPARTMENT_OTHER): Admitting: Radiology

## 2024-06-23 ENCOUNTER — Telehealth: Payer: Self-pay | Admitting: *Deleted

## 2024-06-23 NOTE — Telephone Encounter (Addendum)
 Cardiac Catheterization scheduled at [facility] for: Wednesday June 24, 2024 8:30 AM Arrival time [facility] Main Entrance A at: 6:30 AM  Diet: -Nothing to eat after midnight prior to procedure.  Hydration: -May drink clear liquids until leaving for hospital. Approved liquids: Water , clear tea, black coffee, fruit juices-non-citric and without pulp,Gatorade, plain Jello/popsicles.  Drink 16 oz. bottle of water  on the way to the hospital.   Medication instructions: -Hold:  Xarelto -none 818/25 until post procedure -Other usual morning medications can be taken including aspirin  81 mg.  Plan to go home the same day, you will only stay overnight if medically necessary.  You must have responsible adult to drive you home.  Someone must be with you the first 24 hours after you arrive home.  Reviewed procedure instructions with patient's wife ([place]), [place].

## 2024-06-24 ENCOUNTER — Encounter (HOSPITAL_COMMUNITY)
Admission: RE | Disposition: A | Discharge status: Home / Self Care | Payer: Self-pay | Source: Home / Self Care | Attending: Cardiology

## 2024-06-24 ENCOUNTER — Ambulatory Visit (HOSPITAL_COMMUNITY)
Admission: RE | Admit: 2024-06-24 | Discharge: 2024-06-24 | Disposition: A | Discharge status: Home / Self Care | Attending: Cardiology | Admitting: Cardiology

## 2024-06-24 ENCOUNTER — Encounter (HOSPITAL_COMMUNITY): Payer: Self-pay | Admitting: Cardiology

## 2024-06-24 ENCOUNTER — Other Ambulatory Visit: Payer: Self-pay

## 2024-06-24 DIAGNOSIS — Z7901 Long term (current) use of anticoagulants: Secondary | ICD-10-CM | POA: Insufficient documentation

## 2024-06-24 DIAGNOSIS — E785 Hyperlipidemia, unspecified: Secondary | ICD-10-CM | POA: Insufficient documentation

## 2024-06-24 DIAGNOSIS — I2510 Atherosclerotic heart disease of native coronary artery without angina pectoris: Secondary | ICD-10-CM

## 2024-06-24 DIAGNOSIS — Z955 Presence of coronary angioplasty implant and graft: Secondary | ICD-10-CM | POA: Insufficient documentation

## 2024-06-24 DIAGNOSIS — I252 Old myocardial infarction: Secondary | ICD-10-CM | POA: Insufficient documentation

## 2024-06-24 DIAGNOSIS — I25118 Atherosclerotic heart disease of native coronary artery with other forms of angina pectoris: Secondary | ICD-10-CM | POA: Diagnosis not present

## 2024-06-24 DIAGNOSIS — Z79899 Other long term (current) drug therapy: Secondary | ICD-10-CM | POA: Diagnosis not present

## 2024-06-24 DIAGNOSIS — I509 Heart failure, unspecified: Secondary | ICD-10-CM | POA: Insufficient documentation

## 2024-06-24 DIAGNOSIS — I110 Hypertensive heart disease with heart failure: Secondary | ICD-10-CM | POA: Insufficient documentation

## 2024-06-24 DIAGNOSIS — Z86711 Personal history of pulmonary embolism: Secondary | ICD-10-CM | POA: Diagnosis not present

## 2024-06-24 DIAGNOSIS — Z8249 Family history of ischemic heart disease and other diseases of the circulatory system: Secondary | ICD-10-CM | POA: Diagnosis not present

## 2024-06-24 DIAGNOSIS — Z4802 Encounter for removal of sutures: Secondary | ICD-10-CM | POA: Diagnosis not present

## 2024-06-24 DIAGNOSIS — R9439 Abnormal result of other cardiovascular function study: Secondary | ICD-10-CM | POA: Diagnosis present

## 2024-06-24 DIAGNOSIS — Z87891 Personal history of nicotine dependence: Secondary | ICD-10-CM | POA: Insufficient documentation

## 2024-06-24 SURGERY — LEFT HEART CATH AND CORONARY ANGIOGRAPHY
Anesthesia: LOCAL

## 2024-06-24 MED ORDER — HEPARIN SODIUM (PORCINE) 1000 UNIT/ML IJ SOLN
INTRAMUSCULAR | Status: DC | PRN
  Administered 2024-06-24: 5000 [IU] via INTRAVENOUS

## 2024-06-24 MED ORDER — MIDAZOLAM HCL 2 MG/2ML IJ SOLN
INTRAMUSCULAR | Status: AC
  Filled 2024-06-24: qty 2

## 2024-06-24 MED ORDER — MIDAZOLAM HCL 2 MG/2ML IJ SOLN
INTRAMUSCULAR | Status: DC | PRN
  Administered 2024-06-24: 1 mg via INTRAVENOUS

## 2024-06-24 MED ORDER — HEPARIN (PORCINE) IN NACL 1000-0.9 UT/500ML-% IV SOLN
INTRAVENOUS | Status: DC | PRN
  Administered 2024-06-24 (×2): 500 mL

## 2024-06-24 MED ORDER — RIVAROXABAN 20 MG PO TABS: 20.0000 mg | ORAL_TABLET | Freq: Every day | ORAL | Status: AC

## 2024-06-24 MED ORDER — LIDOCAINE HCL (PF) 1 % IJ SOLN
INTRAMUSCULAR | Status: DC | PRN
  Administered 2024-06-24: 2 mL via INTRADERMAL

## 2024-06-24 MED ORDER — LABETALOL HCL 5 MG/ML IV SOLN: 10.0000 mg | INTRAVENOUS | Status: DC | PRN

## 2024-06-24 MED ORDER — SODIUM CHLORIDE 0.9 % IV SOLN: 250.0000 mL | INTRAVENOUS | Status: DC | PRN

## 2024-06-24 MED ORDER — FREE WATER: 500.0000 mL | Freq: Once | Status: DC

## 2024-06-24 MED ORDER — SODIUM CHLORIDE 0.9% FLUSH: 3.0000 mL | Freq: Two times a day (BID) | INTRAVENOUS | Status: DC

## 2024-06-24 MED ORDER — IOHEXOL 350 MG/ML SOLN
INTRAVENOUS | Status: DC | PRN
  Administered 2024-06-24: 35 mL

## 2024-06-24 MED ORDER — SODIUM CHLORIDE 0.9% FLUSH: 3.0000 mL | INTRAVENOUS | Status: DC | PRN

## 2024-06-24 MED ORDER — HYDRALAZINE HCL 20 MG/ML IJ SOLN: 10.0000 mg | INTRAMUSCULAR | Status: DC | PRN

## 2024-06-24 MED ORDER — HEPARIN SODIUM (PORCINE) 1000 UNIT/ML IJ SOLN
INTRAMUSCULAR | Status: AC
  Filled 2024-06-24: qty 10

## 2024-06-24 MED ORDER — FENTANYL CITRATE (PF) 100 MCG/2ML IJ SOLN
INTRAMUSCULAR | Status: AC
  Filled 2024-06-24: qty 2

## 2024-06-24 MED ORDER — ONDANSETRON HCL 4 MG/2ML IJ SOLN: 4.0000 mg | Freq: Four times a day (QID) | INTRAMUSCULAR | Status: DC | PRN

## 2024-06-24 MED ORDER — VERAPAMIL HCL 2.5 MG/ML IV SOLN
INTRAVENOUS | Status: AC
  Filled 2024-06-24: qty 2

## 2024-06-24 MED ORDER — VERAPAMIL HCL 2.5 MG/ML IV SOLN
INTRAVENOUS | Status: DC | PRN
  Administered 2024-06-24: 10 mL via INTRA_ARTERIAL

## 2024-06-24 MED ORDER — ASPIRIN 81 MG PO CHEW: 81.0000 mg | CHEWABLE_TABLET | ORAL | Status: DC

## 2024-06-24 MED ORDER — ACETAMINOPHEN 325 MG PO TABS: 650.0000 mg | ORAL_TABLET | ORAL | Status: DC | PRN

## 2024-06-24 MED ORDER — FENTANYL CITRATE (PF) 100 MCG/2ML IJ SOLN
INTRAMUSCULAR | Status: DC | PRN
  Administered 2024-06-24: 25 ug via INTRAVENOUS

## 2024-06-24 MED ORDER — LIDOCAINE HCL (PF) 1 % IJ SOLN
INTRAMUSCULAR | Status: AC
  Filled 2024-06-24: qty 30

## 2024-06-24 SURGICAL SUPPLY — 9 items
CATH 5FR JL3.5 JR4 ANG PIG MP (CATHETERS) IMPLANT
CATH INFINITI AMBI 5FR TG (CATHETERS) IMPLANT
DEVICE RAD COMP TR BAND LRG (VASCULAR PRODUCTS) IMPLANT
GLIDESHEATH SLEND SS 6F .021 (SHEATH) IMPLANT
GUIDEWIRE INQWIRE 1.5J.035X260 (WIRE) IMPLANT
KIT SINGLE USE MANIFOLD (KITS) IMPLANT
KIT SYRINGE INJ CVI SPIKEX1 (MISCELLANEOUS) IMPLANT
PACK CARDIAC CATHETERIZATION (CUSTOM PROCEDURE TRAY) ×2 IMPLANT
SET ATX-X65L (MISCELLANEOUS) IMPLANT
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
## 2024-06-24 NOTE — Discharge Instructions (Addendum)

## 2024-06-24 NOTE — Interval H&P Note (Signed)
 History and Physical Interval Note:  06/24/2024 8:44 AM  Daniel Bonilla  has presented today for surgery, with the diagnosis of cad.  The various methods of treatment have been discussed with the patient and family. After consideration of risks, benefits and other options for treatment, the patient has consented to  Procedure(s): LEFT HEART CATH AND CORONARY ANGIOGRAPHY (N/A) as a surgical intervention.  The patient's history has been reviewed, patient examined, no change in status, stable for surgery.  I have reviewed the patient's chart and labs.  Questions were answered to the patient's satisfaction.     Chele Cornell J Trinty Marken

## 2024-06-27 ENCOUNTER — Other Ambulatory Visit (HOSPITAL_BASED_OUTPATIENT_CLINIC_OR_DEPARTMENT_OTHER): Admitting: Radiology

## 2024-07-04 NOTE — Telephone Encounter (Signed)
 I received the CT scan report today generated same date open the chart saw with the patient had called looking for report and the patient is wife and a daughter who and RN are very apprehensive as that his study was performed 2 weeks prior.  He was out of the house I met his wife before in the office and I reviewed with her the radiologist words moderately concerning I told them that we would make arrangements Tuesday for pulmonary referral and I think he will need to have biopsy either bronchoscopic or skinny needle for diagnosis.  I also told them that often if people have tumor in the chest it is found incidentally on a CT scan and in general those people do very well and sought to give him some reassurance.  I made the message about scheduling him a high-priority for Tuesday when the office reopens.

## 2024-07-07 ENCOUNTER — Other Ambulatory Visit: Payer: Self-pay

## 2024-07-07 DIAGNOSIS — R911 Solitary pulmonary nodule: Secondary | ICD-10-CM

## 2024-07-08 DIAGNOSIS — R918 Other nonspecific abnormal finding of lung field: Secondary | ICD-10-CM | POA: Diagnosis not present

## 2024-07-08 DIAGNOSIS — J449 Chronic obstructive pulmonary disease, unspecified: Secondary | ICD-10-CM | POA: Diagnosis not present

## 2024-07-08 DIAGNOSIS — G4733 Obstructive sleep apnea (adult) (pediatric): Secondary | ICD-10-CM | POA: Diagnosis not present

## 2024-07-13 DIAGNOSIS — I209 Angina pectoris, unspecified: Secondary | ICD-10-CM | POA: Insufficient documentation

## 2024-07-14 ENCOUNTER — Ambulatory Visit

## 2024-07-14 ENCOUNTER — Other Ambulatory Visit (HOSPITAL_COMMUNITY): Payer: Self-pay

## 2024-07-14 ENCOUNTER — Telehealth: Payer: Self-pay | Admitting: Pharmacy Technician

## 2024-07-14 ENCOUNTER — Telehealth: Payer: Self-pay

## 2024-07-14 VITALS — BP 126/88 | HR 81 | Ht 68.0 in | Wt 205.0 lb

## 2024-07-14 DIAGNOSIS — K8689 Other specified diseases of pancreas: Secondary | ICD-10-CM | POA: Diagnosis not present

## 2024-07-14 DIAGNOSIS — I10 Essential (primary) hypertension: Secondary | ICD-10-CM

## 2024-07-14 DIAGNOSIS — I2510 Atherosclerotic heart disease of native coronary artery without angina pectoris: Secondary | ICD-10-CM | POA: Diagnosis not present

## 2024-07-14 DIAGNOSIS — R911 Solitary pulmonary nodule: Secondary | ICD-10-CM | POA: Insufficient documentation

## 2024-07-14 DIAGNOSIS — E782 Mixed hyperlipidemia: Secondary | ICD-10-CM | POA: Diagnosis not present

## 2024-07-14 MED ORDER — ISOSORBIDE MONONITRATE ER 30 MG PO TB24: 30.0000 mg | ORAL_TABLET | Freq: Every day | ORAL | 3 refills | Status: AC

## 2024-07-14 MED ORDER — EZETIMIBE 10 MG PO TABS: 10.0000 mg | ORAL_TABLET | Freq: Every day | ORAL | 3 refills | Status: AC

## 2024-07-14 NOTE — Patient Instructions (Addendum)
 Medication Instructions:  Your physician has recommended you make the following change in your medication:   START: Zetia  10 mg daily START: Imdur  30 mg daily  *If you need a refill on your cardiac medications before your next appointment, please call your pharmacy*  Lab Work: None If you have labs (blood work) drawn today and your tests are completely normal, you will receive your results only by: MyChart Message (if you have MyChart) OR A paper copy in the mail If you have any lab test that is abnormal or we need to change your treatment, we will call you to review the results.  Testing/Procedures: None  Follow-Up: At [facility], you and your health needs are our priority.  As part of our continuing mission to provide you with exceptional heart care, our providers are all part of one team.  This team includes your primary Cardiologist (physician) and Advanced Practice Providers or APPs (Physician Assistants and Nurse Practitioners) who all work together to provide you with the care you need, when you need it.  Your next appointment:   6 month(s)  Provider:   Alean Kobus, MD    We recommend signing up for the patient portal called MyChart.  Sign up information is provided on this After Visit Summary.  MyChart is used to connect with patients for Virtual Visits (Telemedicine).  Patients are able to view lab/test results, encounter notes, upcoming appointments, etc.  Non-urgent messages can be sent to your provider as well.   To learn more about what you can do with MyChart, go to [email or web address].   Other Instructions None

## 2024-07-14 NOTE — Telephone Encounter (Signed)
 Pharmacy Patient Advocate Encounter  Received notification from [employer] that Prior Authorization for repatha  has been APPROVED from 07/14/24 to 01/10/25. Ran test claim, Copay is $47.00 one month. This test claim was processed through [facility]- copay amounts may vary at other pharmacies due to pharmacy/plan contracts, or as the patient moves through the different stages of their insurance plan.   PA #/Case ID/Reference #: [id number]

## 2024-07-14 NOTE — Assessment & Plan Note (Signed)
 Lung nodules noted on cardiac PET/CT stress test. Tells me that he has had a follow-up CT chest at [facility] recently to follow-up on the lung nodules, results of this not available to me at this time.  Given his history of smoking and lung nodules and suspicion for COPD referring him to pulmonologist at [facility] pulmonology for further evaluation as per his request.

## 2024-07-14 NOTE — Telephone Encounter (Signed)
 Pharmacy Patient Advocate Encounter   Received notification from Physician's Office - richard cox that prior authorization for repatha  is required/requested.   Insurance verification completed.   The patient is insured through [facility] [employer] .   Per test claim: PA required; PA submitted to above mentioned insurance via Latent Key/confirmation #/EOC [id number] Status is pending

## 2024-07-14 NOTE — Telephone Encounter (Signed)
 The doctor wants to prescribe Repatha  but he wanted to do a price check for Repatha  before he prescribed it to see if the patient could afford the medication. Please advise.

## 2024-07-14 NOTE — Assessment & Plan Note (Signed)
 Previously known history of pancreatic cyst once again noted on extracardiac findings of the cardiac PET stress test done August 2025. He is aware and is following up with his PCP tells me that he has follow-up imaging every couple years for this and last imaging was in 2021.

## 2024-07-14 NOTE — Telephone Encounter (Signed)
$  47.00 for one month
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
## 2024-07-14 NOTE — Assessment & Plan Note (Signed)
 Suboptimal. Statin intolerant. Start Zetia  10 mg once daily.  Will price check for Repatha  or Praluent for cost coverage and will prescribe if affordable.  He is agreeable.

## 2024-07-14 NOTE — Assessment & Plan Note (Addendum)
 Currently remains with good baseline functional status. Remains on Xarelto  for his history of DVT/PE and was recommended to stay on this lifelong by hematologist. Hence currently not on any other antiplatelet agent. Has sublingual nitroglycerin  to use as needed. Will start isosorbide  mononitrate 30 mg once daily to help with angina he is agreeable. Hesitant to start medications and hence we will go with 1 medication at a time to help with the symptoms if symptoms not controlled on Imdur  will add calcium  channel blocker such as amlodipine.  Beta-blockers will hold off given possible underlying history of COPD currently pending evaluation.

## 2024-07-14 NOTE — Assessment & Plan Note (Addendum)
 Well-controlled on current regimen with valsartan  40 mg 2 times daily. Target below 130/80 mmHg.  Hypertensive heart disease with mildly reduced LVEF without any obvious wall motion abnormality.  No acute heart failure signs or symptoms. Not on beta-blockers due to suspicion for COPD. Without any heart failure signs or symptoms and preserved LVEF we will hold off on further escalating therapy with spironolactone and SGLT2 inhibitors.

## 2024-07-16 ENCOUNTER — Other Ambulatory Visit (HOSPITAL_COMMUNITY): Payer: Self-pay

## 2024-07-16 ENCOUNTER — Other Ambulatory Visit: Payer: Self-pay

## 2024-07-16 MED ORDER — REPATHA SURECLICK 140 MG/ML ~~LOC~~ SOAJ: 140.0000 mg | SUBCUTANEOUS | 12 refills | Status: AC

## 2024-07-16 NOTE — Telephone Encounter (Signed)
 Called the patient and spoke to his wife per DPR. The patient's wife was informed of the price of the Repatha  medication based on their insurance plan below:  Plan is covering Repatha  for a $47 copay, no PA required at this time.    Patient's wife agreed to have the medication prescribed and a prescription was sent to the patient's pharmacy after validation with Dr. Liborio. Patient's wife had no further questions at this time.

## 2024-07-16 NOTE — Telephone Encounter (Signed)
 Plan is covering Repatha  for a $47 copay, no PA required at this time.

## 2024-07-31 ENCOUNTER — Ambulatory Visit

## 2024-08-20 ENCOUNTER — Ambulatory Visit: Admitting: Emergency Medicine

## 2024-08-20 ENCOUNTER — Encounter: Payer: Self-pay | Admitting: Emergency Medicine

## 2024-08-20 VITALS — BP 114/81 | HR 98 | Temp 98.2°F | Ht 68.0 in | Wt 205.4 lb

## 2024-08-20 DIAGNOSIS — R918 Other nonspecific abnormal finding of lung field: Secondary | ICD-10-CM

## 2024-08-20 NOTE — Patient Instructions (Signed)
  VISIT SUMMARY: Today, we discussed your ongoing health concerns, including your pulmonary nodules and chronic obstructive pulmonary disease (COPD). We reviewed your recent imaging results and planned the next steps for your care.  YOUR PLAN: -PULMONARY NODULES: You have multiple nodules in your lungs, with one that has grown slightly. This raises some concern for lung cancer, especially given your history of smoking and asbestos exposure. We will order pulmonary function tests and a repeat CT chest scan in February 2026. Depending on the results, we may consider a bronchoscopy to take a closer look at the nodules and possibly a surgical referral if cancer is confirmed.  -CHRONIC OBSTRUCTIVE PULMONARY DISEASE (COPD): COPD is a lung condition that makes it hard to breathe and is often caused by smoking. Your imaging shows signs of moderate emphysema, a type of COPD. We will order pulmonary function tests to see how severe your COPD is and may start you on inhaler therapy based on the results.  INSTRUCTIONS: Please schedule your pulmonary function tests and repeat CT chest scan for February 2026. We will review the results and discuss the next steps in your care. If you experience any new or worsening symptoms, please contact our office immediately.
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
## 2024-09-02 ENCOUNTER — Ambulatory Visit (HOSPITAL_BASED_OUTPATIENT_CLINIC_OR_DEPARTMENT_OTHER)

## 2024-09-02 DIAGNOSIS — R918 Other nonspecific abnormal finding of lung field: Secondary | ICD-10-CM | POA: Diagnosis not present

## 2024-09-02 LAB — PULMONARY FUNCTION TEST
DL/VA % pred: 75 %
DL/VA: 3.12 ml/min/mmHg/L
DLCO unc % pred: 77 %
DLCO unc: 19.16 ml/min/mmHg
FEF 25-75 Post: 2.67 L/s
FEF 25-75 Pre: 2.01 L/s
FEF2575-%Change-Post: 32 %
FEF2575-%Pred-Post: 111 %
FEF2575-%Pred-Pre: 83 %
FEV1-%Change-Post: 9 %
FEV1-%Pred-Post: 102 %
FEV1-%Pred-Pre: 93 %
FEV1-Post: 3.15 L
FEV1-Pre: 2.86 L
FEV1FVC-%Change-Post: 0 %
FEV1FVC-%Pred-Pre: 95 %
FEV6-%Change-Post: 8 %
FEV6-%Pred-Post: 111 %
FEV6-%Pred-Pre: 102 %
FEV6-Post: 4.39 L
FEV6-Pre: 4.04 L
FEV6FVC-%Change-Post: 0 %
FEV6FVC-%Pred-Post: 105 %
FEV6FVC-%Pred-Pre: 105 %
FVC-%Change-Post: 9 %
FVC-%Pred-Post: 105 %
FVC-%Pred-Pre: 96 %
FVC-Post: 4.42 L
FVC-Pre: 4.04 L
Post FEV1/FVC ratio: 71 %
Post FEV6/FVC ratio: 99 %
Pre FEV1/FVC ratio: 71 %
Pre FEV6/FVC Ratio: 100 %
RV % pred: 142 %
RV: 3.27 L
TLC % pred: 113 %
TLC: 7.51 L

## 2024-09-02 NOTE — Progress Notes (Signed)
 Full PFT performed today.

## 2024-09-02 NOTE — Patient Instructions (Signed)
 Full PFT performed today.

## 2024-09-23 ENCOUNTER — Encounter: Payer: Self-pay | Admitting: Emergency Medicine

## 2024-09-23 ENCOUNTER — Ambulatory Visit: Admitting: Emergency Medicine

## 2024-09-23 VITALS — BP 122/84 | HR 111 | Ht 68.0 in | Wt 209.8 lb

## 2024-09-23 DIAGNOSIS — R1909 Other intra-abdominal and pelvic swelling, mass and lump: Secondary | ICD-10-CM | POA: Diagnosis not present

## 2024-09-23 DIAGNOSIS — Z87891 Personal history of nicotine dependence: Secondary | ICD-10-CM

## 2024-09-23 DIAGNOSIS — R911 Solitary pulmonary nodule: Secondary | ICD-10-CM | POA: Diagnosis not present

## 2024-09-23 DIAGNOSIS — J439 Emphysema, unspecified: Secondary | ICD-10-CM | POA: Diagnosis not present

## 2024-09-23 DIAGNOSIS — K8689 Other specified diseases of pancreas: Secondary | ICD-10-CM

## 2024-09-23 MED ORDER — ALBUTEROL SULFATE HFA 108 (90 BASE) MCG/ACT IN AERS: 2.0000 | INHALATION_SPRAY | Freq: Four times a day (QID) | RESPIRATORY_TRACT | 5 refills | Status: AC | PRN

## 2024-09-23 NOTE — Patient Instructions (Signed)
 We reviewed your pulmonary function testing today. We will write a prescription for an albuterol  inhaler that you can have available to use 2 puffs if you develop shortness of breath, chest tightness, wheezing. We will hold off on starting a scheduled inhaler medication for now.  This may be worth doing at some point in the future. We will repeat your CT scan of the chest in February as planned. 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 Follow Dr. Shelah in February after your CT chest so we can review those results together.
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
## 2024-09-24 ENCOUNTER — Other Ambulatory Visit: Payer: Self-pay | Admitting: General Surgery

## 2024-09-24 DIAGNOSIS — K862 Cyst of pancreas: Secondary | ICD-10-CM

## 2024-09-30 NOTE — Assessment & Plan Note (Addendum)
 Plan to repeat his CT chest 12/2024.

## 2024-09-30 NOTE — Assessment & Plan Note (Addendum)
 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

## 2024-09-30 NOTE — Assessment & Plan Note (Addendum)
 Pulmonary function testing reviewed shows grossly normal airflows with a borderline bronchodilator response, some hyperinflation.  We can revisit initiation of scheduled BD therapy if he develops dyspnea, respiratory symptoms.

## 2024-10-31 ENCOUNTER — Other Ambulatory Visit

## 2024-11-16 ENCOUNTER — Ambulatory Visit
Admission: RE | Admit: 2024-11-16 | Discharge: 2024-11-16 | Disposition: A | Source: Ambulatory Visit | Attending: General Surgery | Admitting: General Surgery

## 2024-11-16 DIAGNOSIS — K862 Cyst of pancreas: Secondary | ICD-10-CM

## 2024-11-16 MED ORDER — GADOPICLENOL 0.5 MMOL/ML IV SOLN
10.0000 mL | Freq: Once | INTRAVENOUS | Status: AC | PRN
  Administered 2024-11-16: 10 mL via INTRAVENOUS

## 2024-11-24 ENCOUNTER — Ambulatory Visit: Payer: Self-pay | Admitting: General Surgery

## 2024-12-07 ENCOUNTER — Telehealth: Payer: Self-pay

## 2024-12-08 ENCOUNTER — Telehealth: Payer: Self-pay | Admitting: Pharmacy Technician

## 2024-12-08 ENCOUNTER — Other Ambulatory Visit (HOSPITAL_COMMUNITY): Payer: Self-pay

## 2024-12-08 NOTE — Telephone Encounter (Signed)
 Patient Advocate Encounter   The patient was approved for a [employer] grant that will help cover the cost of Repatha   Total amount awarded, 2500.00.  Effective: 11/08/24 - 11/07/25   APW:[id number] ERW:[id number] Hmnle:[id number] PI:[id number]   [employer] ID: [id number]   Pharmacy provided with approval and processing information. Patient informed via mychart

## 2024-12-15 ENCOUNTER — Other Ambulatory Visit
